# Patient Record
Sex: Male | Born: 1950 | Race: White | Hispanic: No | Marital: Married | Smoking: Never smoker
Health system: Southern US, Community
[De-identification: ages and names within clinical notes are randomized; demographics above are authoritative.]

## PROBLEM LIST (undated history)

## (undated) DIAGNOSIS — D509 Iron deficiency anemia, unspecified: Secondary | ICD-10-CM

## (undated) DIAGNOSIS — E1140 Type 2 diabetes mellitus with diabetic neuropathy, unspecified: Secondary | ICD-10-CM

## (undated) DIAGNOSIS — E785 Hyperlipidemia, unspecified: Secondary | ICD-10-CM

## (undated) DIAGNOSIS — I2510 Atherosclerotic heart disease of native coronary artery without angina pectoris: Secondary | ICD-10-CM

## (undated) DIAGNOSIS — Z789 Other specified health status: Secondary | ICD-10-CM

## (undated) DIAGNOSIS — I10 Essential (primary) hypertension: Secondary | ICD-10-CM

## (undated) DIAGNOSIS — M1990 Unspecified osteoarthritis, unspecified site: Secondary | ICD-10-CM

## (undated) DIAGNOSIS — K219 Gastro-esophageal reflux disease without esophagitis: Secondary | ICD-10-CM

## (undated) DIAGNOSIS — N400 Enlarged prostate without lower urinary tract symptoms: Secondary | ICD-10-CM

## (undated) DIAGNOSIS — H8110 Benign paroxysmal vertigo, unspecified ear: Secondary | ICD-10-CM

## (undated) HISTORY — DX: Iron deficiency anemia, unspecified: D50.9

## (undated) HISTORY — DX: Hyperlipidemia, unspecified: E78.5

## (undated) HISTORY — DX: Gastro-esophageal reflux disease without esophagitis: K21.9

## (undated) HISTORY — DX: Other specified health status: Z78.9

## (undated) HISTORY — DX: Benign prostatic hyperplasia without lower urinary tract symptoms: N40.0

## (undated) HISTORY — DX: Essential (primary) hypertension: I10

## (undated) HISTORY — DX: Unspecified osteoarthritis, unspecified site: M19.90

## (undated) HISTORY — DX: Type 2 diabetes mellitus with diabetic neuropathy, unspecified: E11.40

## (undated) HISTORY — DX: Benign paroxysmal vertigo, unspecified ear: H81.10

## (undated) HISTORY — PX: WRIST SURGERY: SHX841

## (undated) HISTORY — DX: Atherosclerotic heart disease of native coronary artery without angina pectoris: I25.10

---
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
## 1998-08-03 ENCOUNTER — Ambulatory Visit (HOSPITAL_BASED_OUTPATIENT_CLINIC_OR_DEPARTMENT_OTHER): Admission: RE | Admit: 1998-08-03 | Discharge: 1998-08-03 | Payer: Self-pay | Admitting: Orthopedic Surgery

## 2000-11-04 ENCOUNTER — Encounter: Admission: RE | Admit: 2000-11-04 | Discharge: 2000-11-04 | Payer: Self-pay | Admitting: Family Medicine

## 2000-11-04 ENCOUNTER — Encounter: Payer: Self-pay | Admitting: Family Medicine

## 2000-11-10 ENCOUNTER — Encounter: Payer: Self-pay | Admitting: General Surgery

## 2000-11-13 ENCOUNTER — Encounter: Payer: Self-pay | Admitting: General Surgery

## 2000-11-13 ENCOUNTER — Encounter (INDEPENDENT_AMBULATORY_CARE_PROVIDER_SITE_OTHER): Payer: Self-pay | Admitting: Specialist

## 2000-11-13 ENCOUNTER — Observation Stay (HOSPITAL_COMMUNITY): Admission: RE | Admit: 2000-11-13 | Discharge: 2000-11-14 | Payer: Self-pay | Admitting: General Surgery

## 2000-12-22 HISTORY — PX: CHOLECYSTECTOMY: SHX55

## 2001-01-09 ENCOUNTER — Inpatient Hospital Stay (HOSPITAL_COMMUNITY): Admission: EM | Admit: 2001-01-09 | Discharge: 2001-01-11 | Payer: Self-pay | Admitting: Emergency Medicine

## 2001-01-09 ENCOUNTER — Encounter: Payer: Self-pay | Admitting: Emergency Medicine

## 2002-11-22 ENCOUNTER — Encounter: Payer: Self-pay | Admitting: Family Medicine

## 2002-11-22 ENCOUNTER — Encounter: Admission: RE | Admit: 2002-11-22 | Discharge: 2002-11-22 | Payer: Self-pay | Admitting: Family Medicine

## 2004-05-08 ENCOUNTER — Encounter: Admission: RE | Admit: 2004-05-08 | Discharge: 2004-05-08 | Payer: Self-pay | Admitting: Gastroenterology

## 2004-07-01 ENCOUNTER — Ambulatory Visit (HOSPITAL_COMMUNITY): Admission: RE | Admit: 2004-07-01 | Discharge: 2004-07-01 | Payer: Self-pay | Admitting: Gastroenterology

## 2005-04-06 IMAGING — CT CT ABDOMEN W/ CM
1 series · 15 of 32 positions shown, 19 images · IV contrast (GASTRO & OMNIPAQUE 125)
Comparison: none

CLINICAL DATA: Right upper and left lower quadrant pain status post cholecystectomy in 9336. 
 CT ABDOMEN AND PELVIS WITH CONTRAST
TECHNIQUE: Multidetector helical CT imaging was performed during the IV bolus injection of 125 cc Omnipaque 300.  Oral contrast was given.  Correlation is made with a prior study dated 11/04/00 from [REDACTED]. 
 CT ABDOMEN
 The lung bases are clear.  There is no pleural effusion. The gallbladder is surgically absent.  There is no biliary dilatation.  6 mm low attenuation anteriorly in the liver near the middle hepatic vein on image 13 is unchanged from a prior study.  No new liver lesions are present.  The spleen, pancreas, adrenal glands, and kidneys appear normal.  There is no lymphadenopathy or ascites.
 IMPRESSION
 1.  Stable CT of the abdomen status post interval cholecystectomy.  There is no biliary dilatation.
 2.  Small liver lesion is unchanged.
 CT PELVIS
 There is no evidence of pelvic mass, fluid collection, or inflammatory process.  No adenopathy or hernia is demonstrated.
 Negative CT of the pelvis.  No inflammatory process demonstrated.

[Series 2: — · axial · 0.89mm/px · z∈[-360,+35]mm · 15 of 117 slices shown, 19 images]
[im 8/117  soft-tissue]
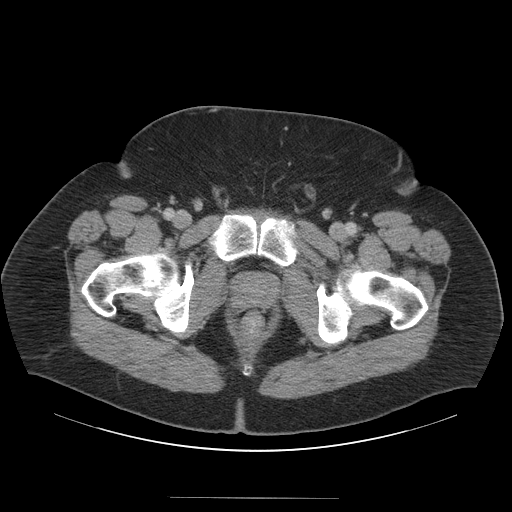
[im 8/117  bone]
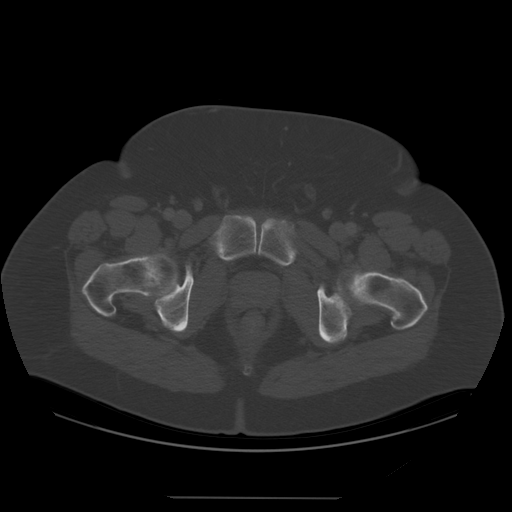
[im 15/117  soft-tissue]
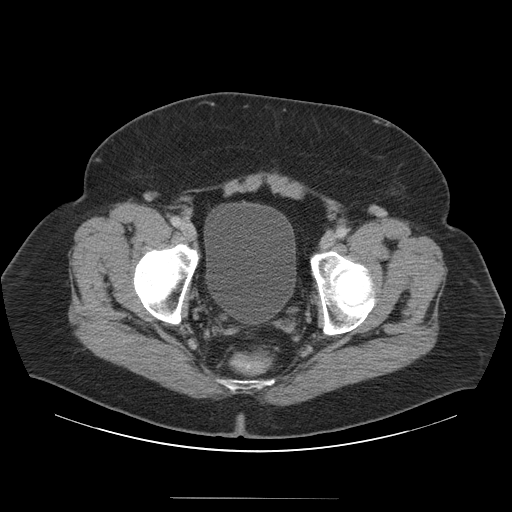
[im 23/117  soft-tissue]
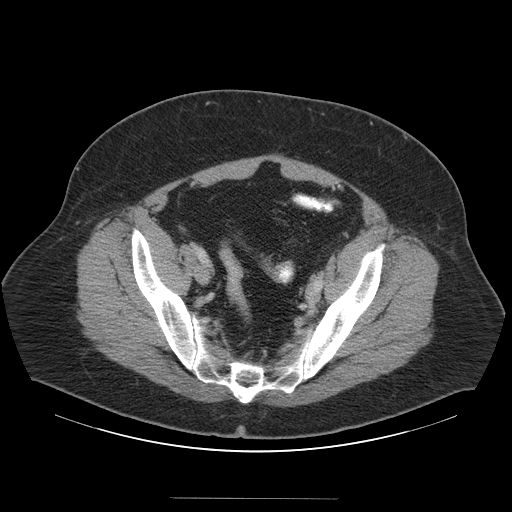
[im 34/117  soft-tissue]
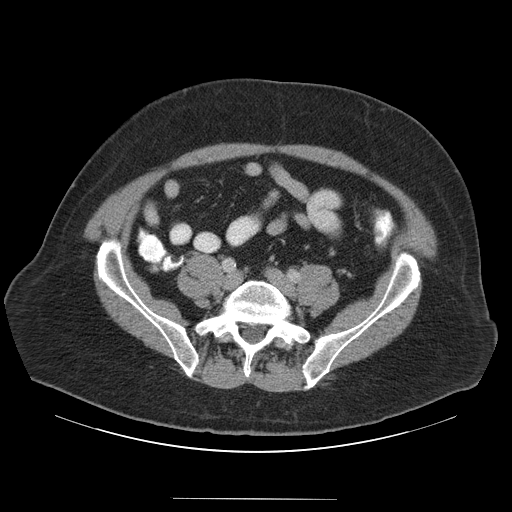
[im 42/117  soft-tissue]
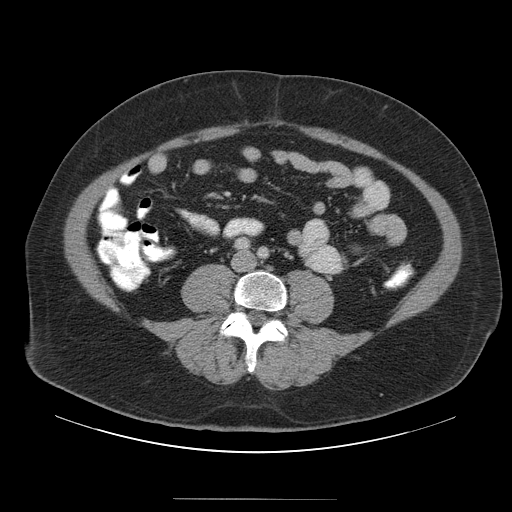
[im 49/117  soft-tissue]
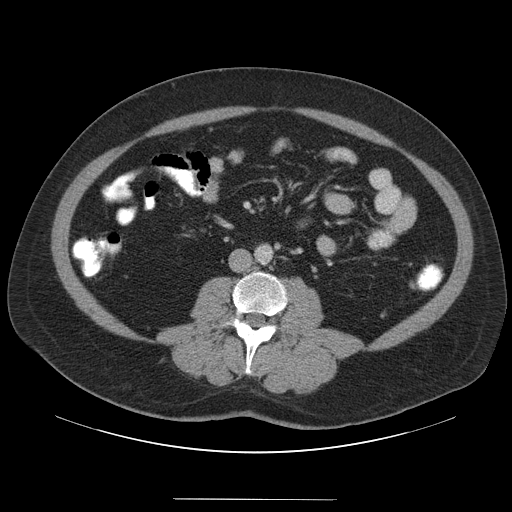
[im 60/117  soft-tissue]
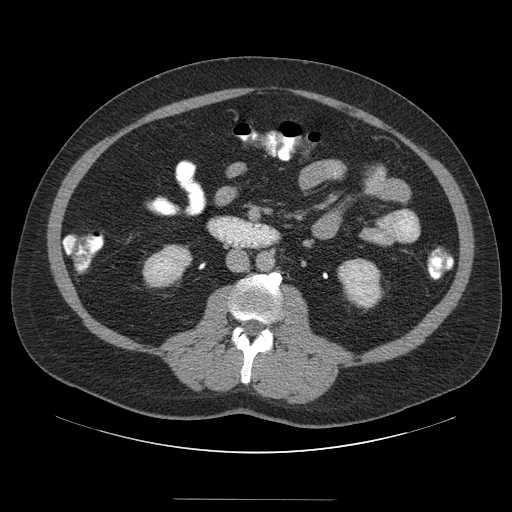
[im 68/117  soft-tissue]
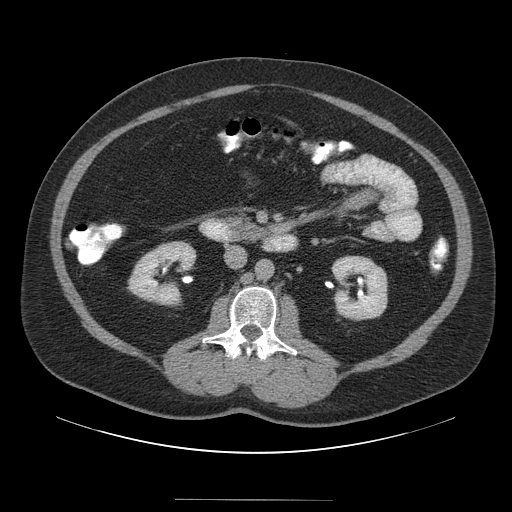
[im 75/117  soft-tissue]
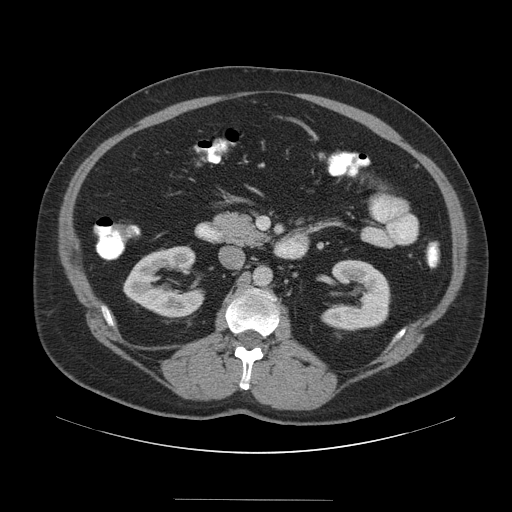
[im 75/117  bone]
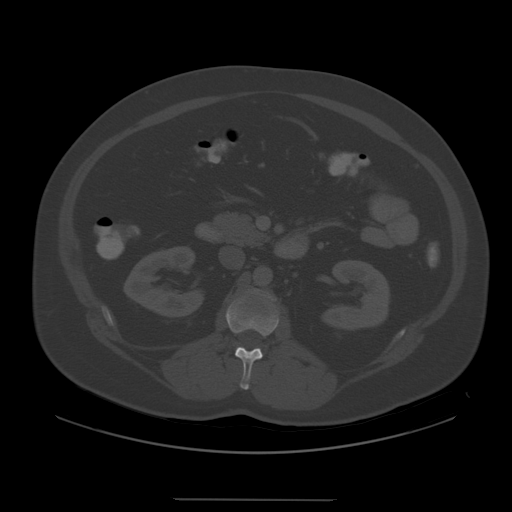
[im 83/117  soft-tissue]
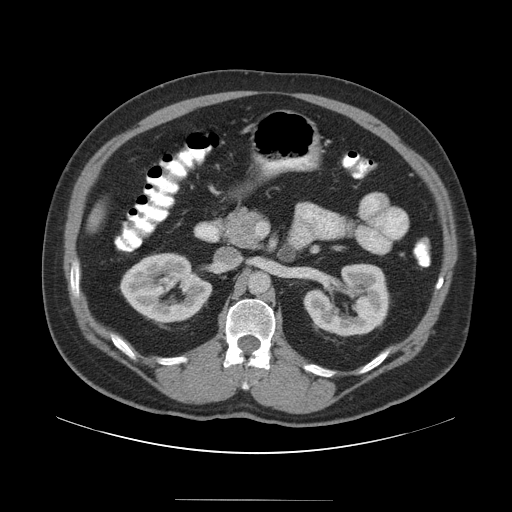
[im 94/117  soft-tissue]
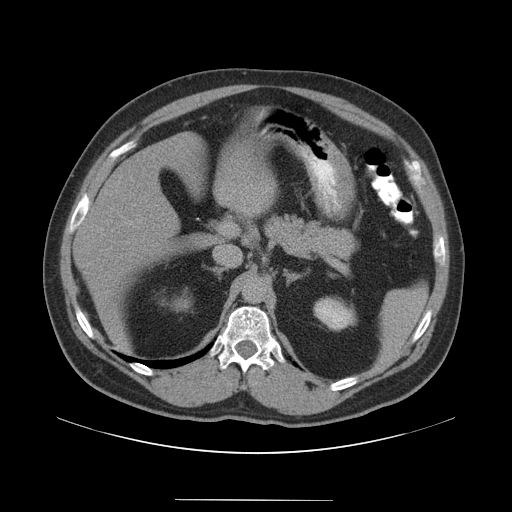
[im 102/117  soft-tissue]
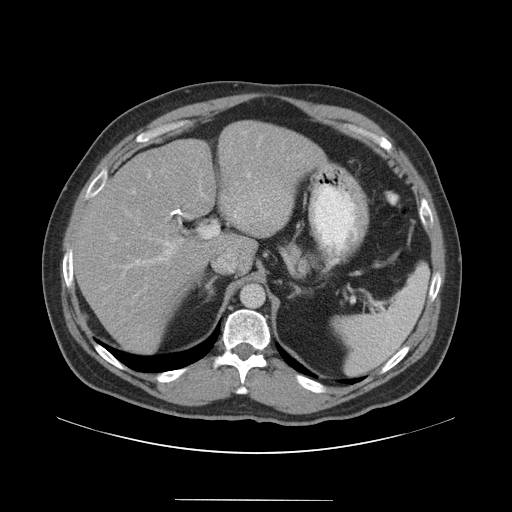
[im 102/117  lung]
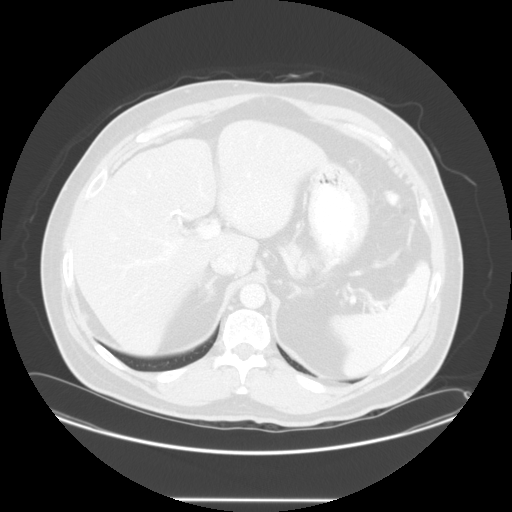
[im 105/117  lung]
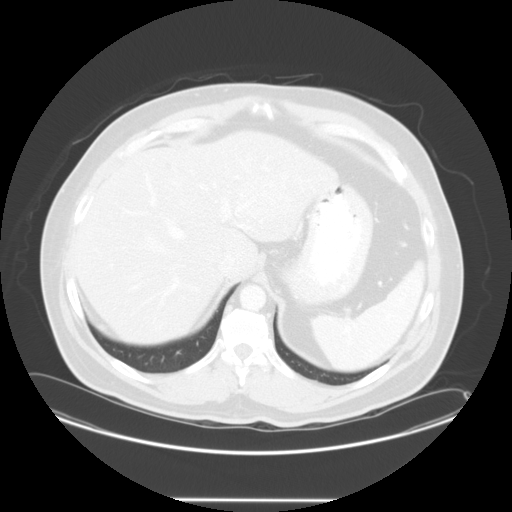
[im 109/117  soft-tissue]
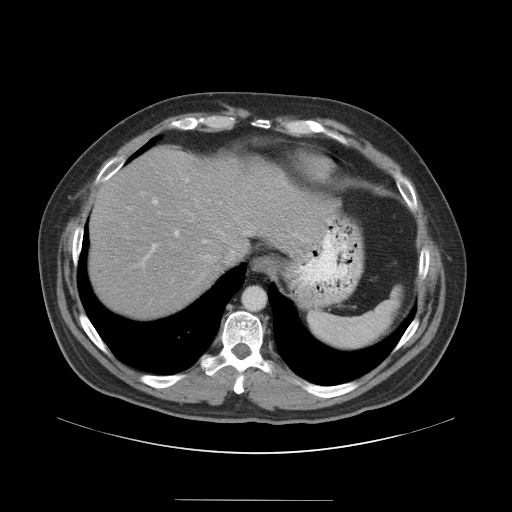
[im 109/117  lung]
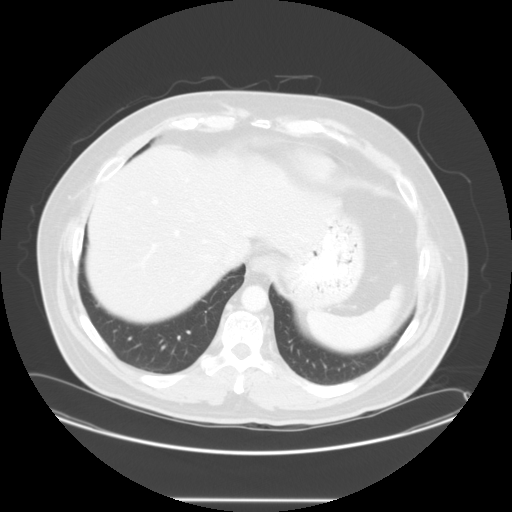
[im 113/117  lung]
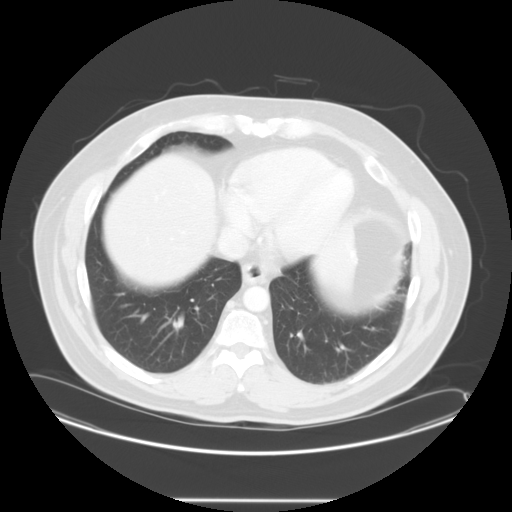

[15 of 32 positions shown; findings below may reference images not displayed]

## 2006-03-11 ENCOUNTER — Encounter: Admission: RE | Admit: 2006-03-11 | Discharge: 2006-03-11 | Payer: Self-pay | Admitting: Family Medicine

## 2006-09-12 ENCOUNTER — Encounter: Admission: RE | Admit: 2006-09-12 | Discharge: 2006-09-12 | Payer: Self-pay | Admitting: Orthopedic Surgery

## 2006-12-22 HISTORY — PX: ROTATOR CUFF REPAIR: SHX139

## 2007-01-22 ENCOUNTER — Ambulatory Visit (HOSPITAL_BASED_OUTPATIENT_CLINIC_OR_DEPARTMENT_OTHER): Admission: RE | Admit: 2007-01-22 | Discharge: 2007-01-22 | Payer: Self-pay | Admitting: Orthopedic Surgery

## 2007-02-01 ENCOUNTER — Encounter: Admission: RE | Admit: 2007-02-01 | Discharge: 2007-03-18 | Payer: Self-pay | Admitting: Orthopedic Surgery

## 2009-11-20 ENCOUNTER — Ambulatory Visit: Payer: Self-pay | Admitting: Diagnostic Radiology

## 2009-11-20 ENCOUNTER — Emergency Department (HOSPITAL_BASED_OUTPATIENT_CLINIC_OR_DEPARTMENT_OTHER): Admission: EM | Admit: 2009-11-20 | Discharge: 2009-11-21 | Payer: Self-pay | Admitting: Emergency Medicine

## 2010-10-19 IMAGING — CR DG ELBOW COMPLETE 3+V*R*
4 series · 4 of 4 positions shown · non-contrast
Comparison: None

CLINICAL DATA: Status post fall, with posterior right elbow pain.

RIGHT ELBOW - COMPLETE 3+ VIEW

[x elbow joint ap right]
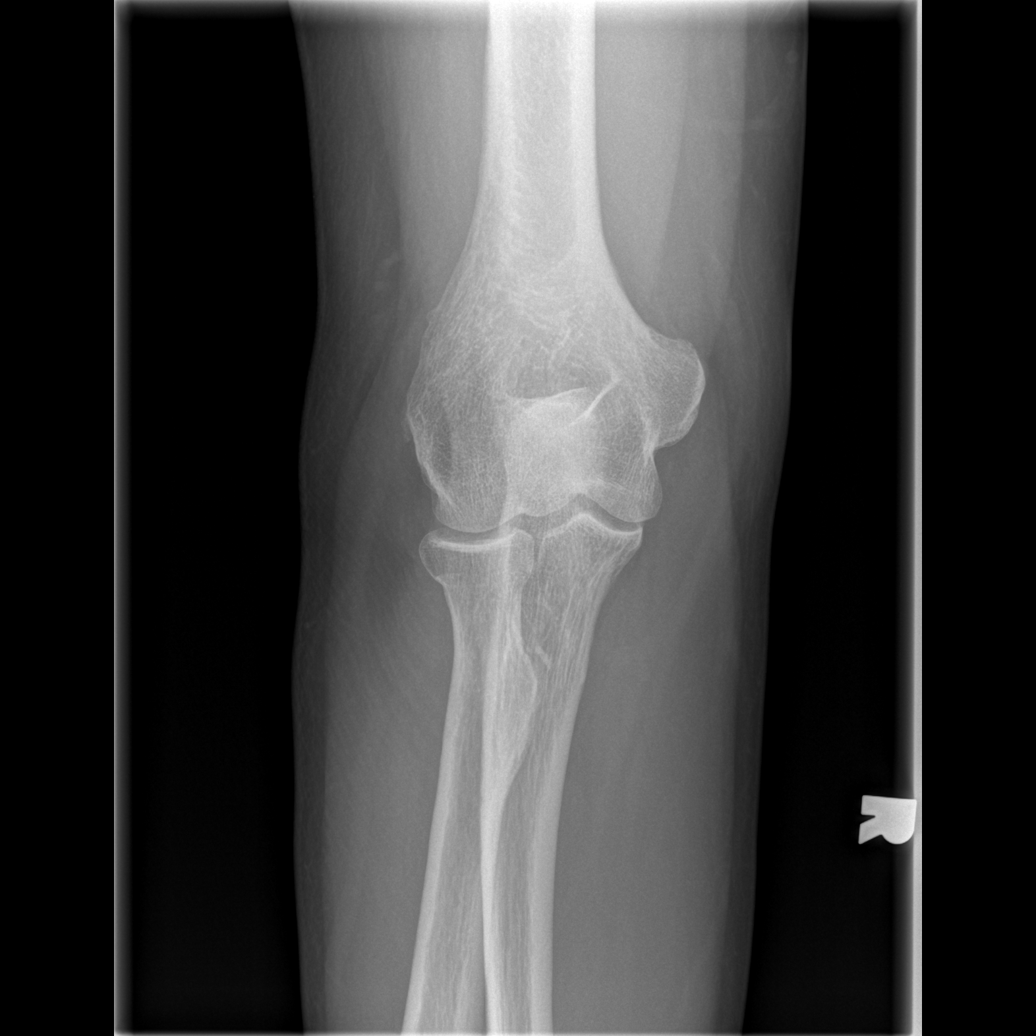

[x elbow joint obl. right (1 of 2)]
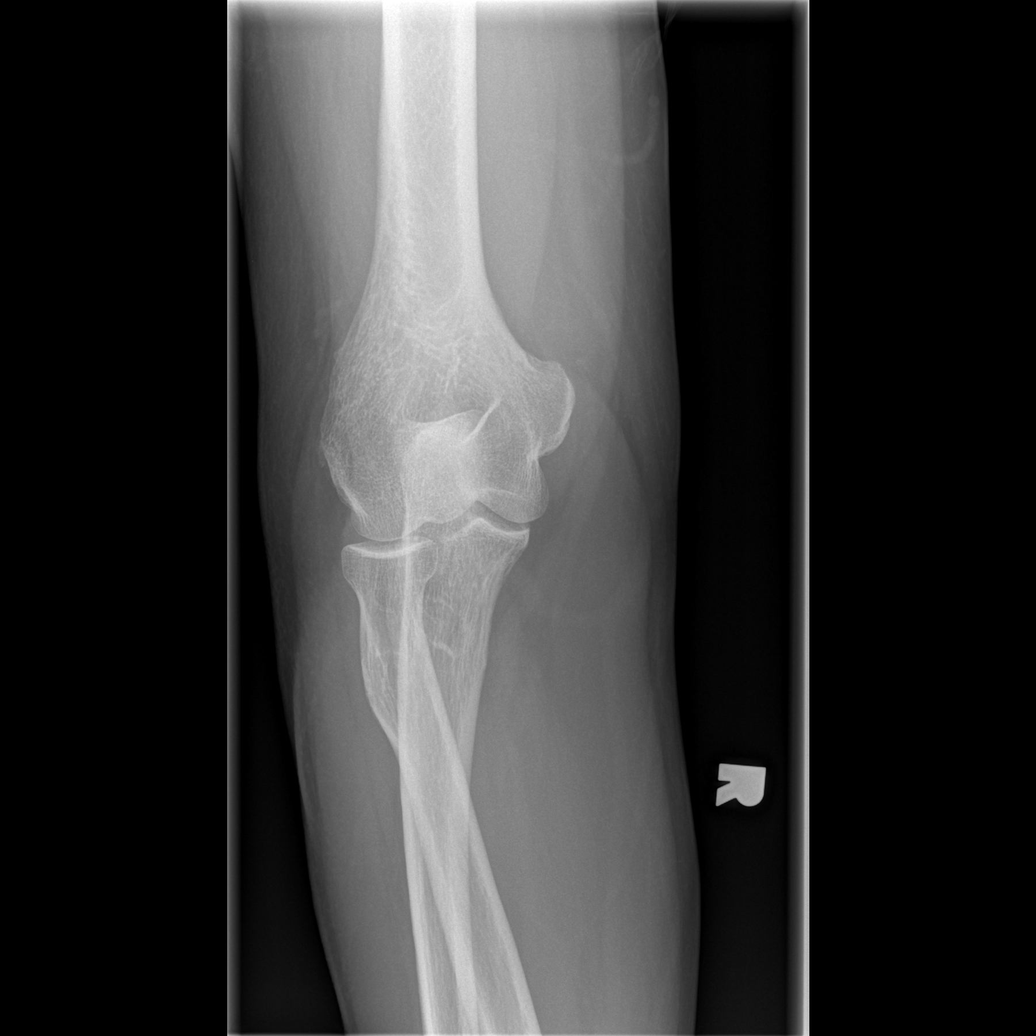

[x elbow joint obl. right (2 of 2)]
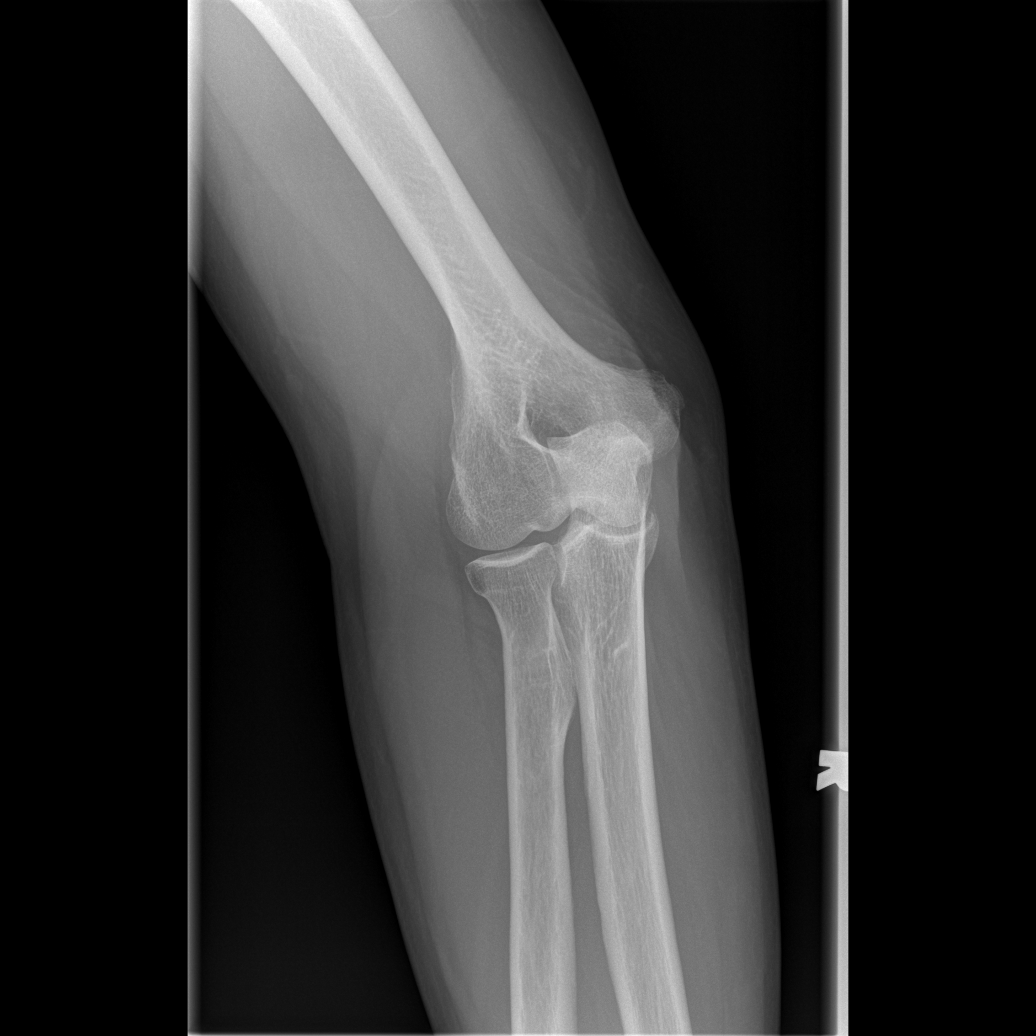

[x elbow joint lat right]
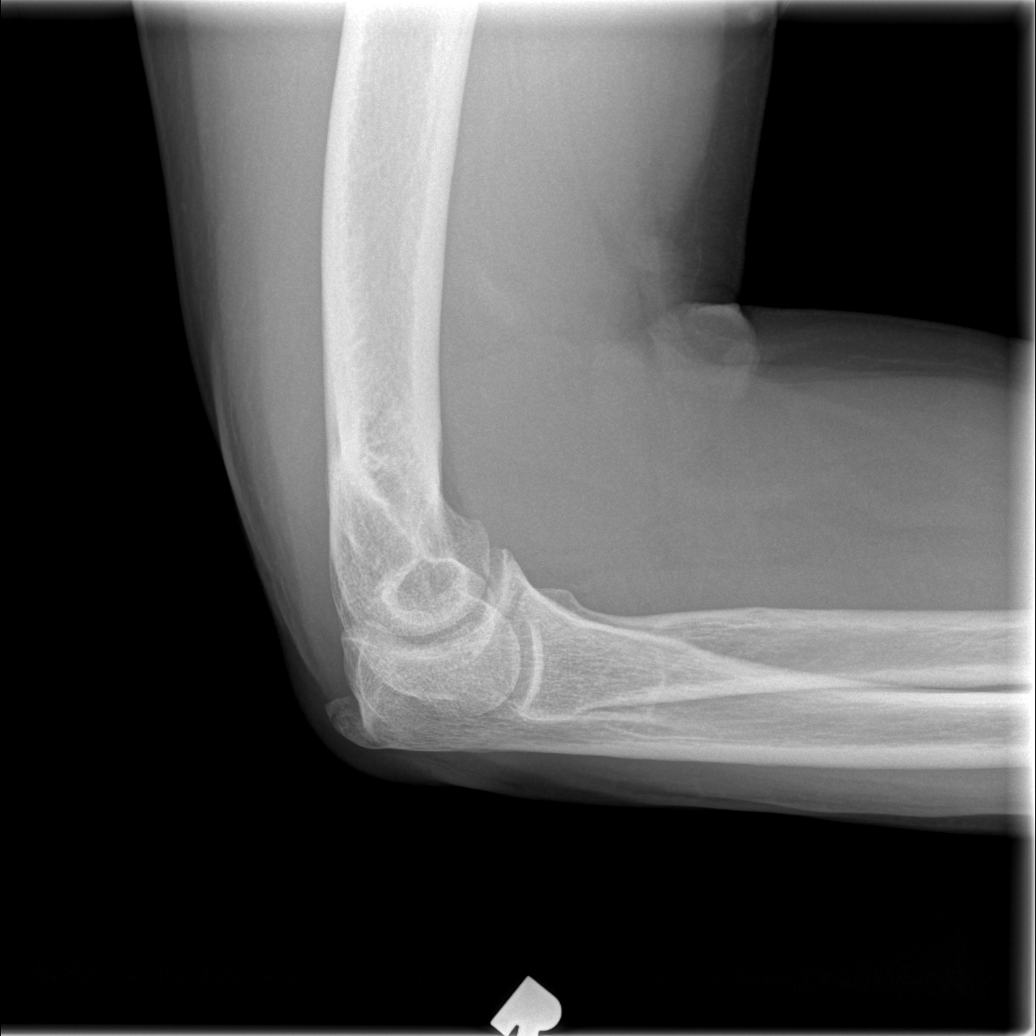

[4 of 4 positions shown; findings below may reference images not displayed]

FINDINGS: There is no evidence of fracture or dislocation.  The
joint spaces are preserved.  No significant joint effusion is
identified.  Enthesophyte formation is noted at the olecranon.  No
significant soft tissue abnormalities are seen.
IMPRESSION: No evidence of fracture or dislocation.

## 2010-12-06 ENCOUNTER — Ambulatory Visit: Payer: Self-pay | Admitting: Cardiology

## 2010-12-10 ENCOUNTER — Telehealth (INDEPENDENT_AMBULATORY_CARE_PROVIDER_SITE_OTHER): Payer: Self-pay | Admitting: *Deleted

## 2010-12-11 ENCOUNTER — Encounter: Payer: Self-pay | Admitting: Internal Medicine

## 2010-12-11 ENCOUNTER — Encounter (HOSPITAL_COMMUNITY)
Admission: RE | Admit: 2010-12-11 | Discharge: 2011-01-21 | Payer: Self-pay | Source: Home / Self Care | Attending: Cardiology | Admitting: Cardiology

## 2010-12-11 ENCOUNTER — Ambulatory Visit: Payer: Self-pay

## 2010-12-12 ENCOUNTER — Encounter: Payer: Self-pay | Admitting: Internal Medicine

## 2010-12-17 ENCOUNTER — Ambulatory Visit: Payer: Self-pay | Admitting: Cardiology

## 2011-01-12 ENCOUNTER — Encounter: Payer: Self-pay | Admitting: Family Medicine

## 2011-01-23 NOTE — Progress Notes (Signed)
Summary: Nuclear Pre-Procedure  Phone Note Outgoing Call Call back at [facility] Phone [phone]   Call placed by: Stanton Kidney, EMT-P,  December 10, 2010 2:19 PM Action Taken: Phone Call Completed Summary of Call: Left message with information on [employer] Information Sheet (see scanned document for details). Stanton Kidney, EMT-P  December 10, 2010 2:19 PM     Nuclear Med Background Indications for Stress Test: Evaluation for Ischemia   History: Heart Catheterization  History Comments: '01 Heart Cath: NL  Symptoms: Chest Pain, Diaphoresis, SOB    Nuclear Pre-Procedure Cardiac Risk Factors: Hypertension, Lipids

## 2011-01-23 NOTE — Assessment & Plan Note (Addendum)
Summary: Cardiology Nuclear Testing  Nuclear Med Background Indications for Stress Test: Evaluation for Ischemia   History: Heart Catheterization  History Comments: '01 Heart Cath: NL  Symptoms: Chest Pain, Diaphoresis, Palpitations, SOB    Nuclear Pre-Procedure Cardiac Risk Factors: Hypertension, Lipids Caffeine/Decaff Intake: None NPO After: 8:00 AM Lungs: clear IV 0.9% NS with Angio Cath: 22g     IV Site: R Antecubital IV Started by: Bonnita Levan, RN Chest Size (in) 48     Height (in): 69 Weight (lb): 235 BMI: 34.83  Nuclear Med Study 1 or 2 day study:  1 day     Stress Test Type:  Stress Reading MD:  Arvilla Meres, MD     Referring MD:  S.Tennant Resting Radionuclide:  Technetium 62m Tetrofosmin     Resting Radionuclide Dose:  11 mCi  Stress Radionuclide:  Technetium 32m Tetrofosmin     Stress Radionuclide Dose:  33 mCi   Stress Protocol Exercise Time (min):  4:17 min     Max HR:  150 bpm     Predicted Max HR:  159 bpm  Max Systolic BP: 160 mm Hg     Percent Max HR:  94.34 %     METS: 6.10 Rate Pressure Product:  16109    Stress Test Technologist:  Milana Na, EMT-P     Nuclear Technologist:  Doyne Keel, CNMT  Rest Procedure  Myocardial perfusion imaging was performed at rest 45 minutes following the intravenous administration of Technetium 2m Tetrofosmin.  Stress Procedure  The patient exercised for 4:16. The patient stopped due to fatigue, sob, and chest tightness.  There were non specific ST-T wave changes and rare pvcs.  Technetium 4m Tetrofosmin was injected at peak exercise and myocardial perfusion imaging was performed after a brief delay.  QPS Raw Data Images:  Normal; no motion artifact; normal heart/lung ratio. Stress Images:  Normal homogeneous uptake in all areas of the myocardium. Rest Images:  Normal homogeneous uptake in all areas of the myocardium. Subtraction (SDS):  Normal Transient Ischemic Dilatation:  0.92  (Normal <1.22)  Lung/Heart Ratio:  0.33  (Normal <0.45)  Quantitative Gated Spect Images QGS EDV:  62 ml QGS ESV:  20 ml QGS EF:  68 % QGS cine images:  Normal  Findings Normal nuclear study      Overall Impression  Exercise Capacity: Fair exercise capacity. BP Response: Normal blood pressure response. Clinical Symptoms: Mild chest pain/dyspnea. ECG Impression: Insignificant upsloping ST segment depression. Overall Impression: Normal stress nuclear study.  Appended Document: Cardiology Nuclear Testing copy sent to Dr.Tennant

## 2011-05-09 NOTE — Cardiovascular Report (Signed)
[place]  Patient:    Daniel Rhodes, Daniel Rhodes                      MRN: [id number] Proc. Date: 01/11/01 Adm. Date:  [id number] Attending:  Rudean Hitt CC:         Thomas A. Patty Sermons, M.D.   Cardiac Catheterization  INDICATION FOR PROCEDURE:  The patient is a 60 year old white male with history of hypertension and diabetes who presents with prolonged episode of chest pain.  CARDIOLOGIST:  Peter M. Swaziland, M.D.  ACCESS:  Via the right femoral arteries using standard Seldinger technique.  EQUIPMENT:  A 6-French 4 cm right and left Judkins catheters, 6-French pigtail catheter, 6-French arterial sheath.  MEDICATIONS:  Local anesthesia 1% Xylocaine, Versed 2 mg IV.  CONTRAST: 140 cc of Omnipaque.  HEMODYNAMIC DATA:  Aortic pressures 142/85 with a mean of 110 mmHg.  Left ventricular pressure is 149 with EDP of 26 mmHg.  CORONARY ANGIOGRAPHY: The left coronary artery arises and distributes normally.  The left main coronary artery is normal.  The left anterior descending artery has less than or equal to 10% mild irregularities proximally.  The left circumflex coronary artery is normal.  The right coronary artery is normal.  Left ventricular angiography in the RAO and LAO crania views demonstrates normal left ventricular size and contractility with normal systolic function. Ejection fraction is estimated at 65%.  There is no significant mitral valve prolapse or regurgitation.  FINAL INTERPRETATION: 1. No significant atherosclerotic coronary artery disease. 2. Normal left ventricular function. DD:  01/11/01 TD:  01/11/01 Job: [id number]

## 2011-05-09 NOTE — Op Note (Signed)
NAME:  Daniel Rhodes, Daniel Rhodes NO.:  [id number]   MEDICAL RECORD NO.:  [id number]          PATIENT TYPE:  AMB   LOCATION:  DSC                          FACILITY:  [facility]   PHYSICIAN:  Dyke Brackett, M.D.    DATE OF BIRTH:  07/10/1951   DATE OF PROCEDURE:  01/22/2007  DATE OF DISCHARGE:                               OPERATIVE REPORT   PREOPERATIVE DIAGNOSES:  1. Anterior superior labral tear.  2. Partial rotator cuff tear.  3. Impingement.  4. Acromioclavicular joint arthritis.   OPERATION:  1. Arthroscopic acromioplasty.  2. Arthroscopic debridement of torn labrum.  3. Arthroscopic excision of distal clavicle.   SURGEON:  Dyke Brackett, M.D.   ANESTHESIA:  General with a block.   INDICATIONS:  Greater than 6 months of right shoulder pain, only  transient response to injections, thought to be amenable to outpatient  surgery.   DESCRIPTION OF PROCEDURE:  Examination under anesthesia showed no loss  of motion and no instability.  He was arthroscoped through a  posterolateral and anterior portal.  Systematic inspection of the  shoulder showed the patient to have significant fraying type tearing of  the anterior superior labrum.  This was very extensive.  It was not  amenable to repair.  It involved the anterior portion and the superior  portion.  The biceps tendon anchor was intact.  Undersurface tear,  approaching, but not at 50% thickness, probably the full width of the  supraspinatus, debrided.  No degenerative change noted.   Subacromial space was entered, hypertrophied, debrided.  Followed by  decompression, resecting about 7 to 8 mm of the anterior leading edge of  the acromion.  Complete bursectomy carried out, and resection of  approximately 1 cm of the distal clavicle, which was very arthritic.  The superior surface of the cuff appeared normal.  Again, intra-  articular debridement of the labral extensive tear was carried out  separate from the  decompression.  Shoulder drained free of fluid.  Portals closed with nylon.  Lightly compressive sterile dressing  applied.  Taken to the recovery room in stable condition.      Dyke Brackett, M.D.  Electronically Signed    WDC/MEDQ  D:  01/22/2007  T:  01/22/2007  Job:  [id number]

## 2011-05-09 NOTE — Discharge Summary (Signed)
[place]  Patient:    Daniel Rhodes, Daniel Rhodes                      MRN: [id number] Adm. Date:  [id number] Disc. Date: 01/11/01 Attending:  Rudean Hitt CC:         Teena Irani. Arlyce Dice, M.D.   Discharge Summary  HISTORY OF PRESENT ILLNESS:  This is a 60 year old married Caucasian gentleman admitted on January 09, 2001, with severe substernal chest pain.  There was no history of any prior heart problems, but he does have a history of diabetes, hypertension, and hypercholesterolemia.  The patient had gone to bed feeling well, and he awoke about 1 a.m. with severe substernal chest pressure radiating to the shoulders and neck.  He was nauseated and pale but had no vomiting, but he was mildly diaphoretic.  The pain was initially a 10/10 at the onset and improved somewhat after sublingual nitroglycerin in the emergency room.  The patient was placed on an IV nitroglycerin drip, but blood pressure dropped to 80 systolic, and the IV nitroglycerin had to be stopped. His electrocardiogram and initial set of enzymes in the emergency room were negative.  FAMILY HISTORY, SOCIAL HISTORY, REVIEW OF SYSTEMS:  Please see the old chart. Of note is the fact that the patient does have a known hiatal hernia.  PHYSICAL EXAMINATION:  HEENT:  Blood pressure 130/74, pulse 89.  Jugular venous pressure normal.  SKIN:  Color good.  CHEST:  Clear.  HEART:  No murmur, gallop, rub, or click.  ABDOMEN:  Negative.  EXTREMITIES:  Negative.  LABORATORY DATA:  Initial laboratories were normal, except for a white count of 16,700.  Serial CK-MBs were negative x 3.  Troponins were negative x 3. Serial EKGs showed no acute changes.  HOSPITAL COURSE:  The patient was felt to be at high risk for ischemic heart disease because of his multiple risk factors and severe presentation and, therefore, cardiac catheterization was advised.  The patient underwent  cardiac catheterization on the morning of January 11, 2001, by Dr. Peter Swaziland.  At the catheterization he was found to have normal coronary arteries and normal LV function.  Accessory laboratory studies:  His sedimentation rate was 3.  His CBC showed initial white count of 16,000 but by the following day was 8300.  His BUN was initially 29 but on repeat was 18.  Blood sugars were 158 and 145.  The lipids showed a cholesterol of 115, triglycerides 111, HDL cholesterol 45, LDL cholesterol 48.  His urinalysis was unremarkable.  His chest x-ray showed no acute cardiopulmonary findings.  The patient had an uneventful hospital course.  He had no complications postcatheterization.  He was able to be discharged improved on the evening of the catheterization, to be followed by Dr. Dara Hoyer in the office.  DISCHARGE MEDICATIONS: 1. Protonix 40 mg daily. 2. Glipizide 10 mg daily. 3. Ecotrin 81 mg daily. 4. Ibuprofen 400 mg at h.s. 5. Lipitor 10 mg each evening. 6. Toprol XL 50 mg once a day for blood pressure. 7. For the time being, we will continue to leave off his HCTZ, and his blood    pressure has been quite stable in the hospital.  DIET:  Low salt, low cholesterol diet.  ACTIVITY:  Avoid any heavy lifting, stooping, or squatting for the next few days.  FOLLOW-UP:  He will follow up with Dr. Arlyce Dice, and see Dr. Patty Sermons again on a p.r.n. basis.  DISCHARGE INSTRUCTIONS:  He was encouraged to embark on a regular exercise program for long-term cardiovascular fitness.  CONDITION ON DISCHARGE:  Improved. DD:  01/11/01 TD:  01/11/01 Job: [id number]

## 2011-05-09 NOTE — Op Note (Signed)
NAME:  Daniel Rhodes, Daniel Rhodes                         ACCOUNT NO.:  [id number]   MEDICAL RECORD NO.:  [id number]                   PATIENT TYPE:  AMB   LOCATION:  ENDO                                 FACILITY:  [facility]   PHYSICIAN:  Anselmo Rod, M.D.               DATE OF BIRTH:  1951-01-28   DATE OF PROCEDURE:  07/01/2004  DATE OF DISCHARGE:                                 OPERATIVE REPORT   PROCEDURE PERFORMED:  Screening colonoscopy.   ENDOSCOPIST:  Anselmo Rod, M.D.   INSTRUMENT USED:  Olympus video colonoscope.   INDICATION FOR PROCEDURE:  A 60 year old white male with a history of right  upper quadrant abdominal pain, status post laparoscopic cholecystectomy, and  AODM.  Undergoing a screening colonoscopy.  Rule out colonic polyps, masses,  etc.   PREPROCEDURE PREPARATION:  Informed consent was procured from the patient.  The patient had fasted for eight hours prior to the procedure and prepped  with a bottle of magnesium citrate and a gallon of GoLYTELY the night prior  to the procedure.   PREPROCEDURE PHYSICAL:  VITAL SIGNS:  The patient had stable vital signs.  NECK:  Supple.  CHEST:  Clear to auscultation.  S1, S2 regular.  ABDOMEN:  Soft with normal bowel sounds.   DESCRIPTION OF PROCEDURE:  The patient was placed in the left lateral  decubitus position and sedated with 90 mg of Demerol and 7.5 mg of Versed  intravenously in slow incremental doses.  Once the patient was adequately  sedate and maintained on low-flow oxygen and continuous cardiac monitoring,  the Olympus video colonoscope was advanced from the rectum to the cecum.  The appendiceal orifice and the ileocecal valve were clearly visualized and  photographed.  No masses, polyps, erosions, ulcerations, or diverticula were  seen.  Internal hemorrhoids were recognized on retroflexion in the rectum.  The patient tolerated the procedure well without complications.   IMPRESSION:  1. Small internal hemorrhoids,  otherwise unrevealing colonoscopy.  2. Some residual stool in the colon, multiple washes were done.   RECOMMENDATIONS:  1. A repeat colonoscopy has been recommended in the next 10 years unless the     patient develops any abnormal     symptoms in the interim.  2. Continue a high-fiber diet with liberal fluid intake.  3. Outpatient follow-up to re-evaluate right upper quadrant pain, question     adhesions.                                               Anselmo Rod, M.D.    JNM/MEDQ  D:  07/01/2004  T:  07/01/2004  Job:  [id number]   cc:   Teena Irani. Arlyce Dice, M.D.  [address]  [place]  [place] [address]  Fax: [phone]

## 2011-05-09 NOTE — Op Note (Signed)
[facility]  Patient:    Daniel, Rhodes                     MRN: [id number] Proc. Date: 11/13/00 Attending:  Anselm Pancoast. Zachery Dakins, M.D. CC:         Teena Irani. Arlyce Dice, M.D.   Operative Report  PREOPERATIVE DIAGNOSES: 1. Chronic cholelithiasis. 2. History of diabetes mellitus.  POSTOPERATIVE DIAGNOSES: 1. Chronic cholelithiasis. 2. History of diabetes mellitus.  OPERATION:  Laparoscopic cholecystectomy with cholangiogram.  SURGEON:  Anselm Pancoast. Zachery Dakins, M.D.  ASSISTANT:  Gita Kudo, M.D.  ANESTHESIA:  General.  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 Dara Hoyer for symptomatic gallstones.  The patients more recent problem has really been more of a chronic prostatis but he has had some kind of substernal, upper abdominal pain, as well as the fullness in the lower abdomen.  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  The patients liver function studies were normal and since he has these intermittent episodes of epigastric pain, they referred him to our office earlier this week for evaluation and a cholecystectomy.  The patient has not had any severe episodes of pain and he presented to the emergency room, etc., but obviously he is having minimal symptoms with his gallbladder being a diabetic, and we recommended proceeding with a laparoscopic cholecystectomy and cholangiogram.  The only thing that is new is today he has sort of a fine rash over his body. No fever, no upper respiratory tract infection like the flu, etc., and I elected to proceed on with surgery.  DESCRIPTION OF PROCEDURE:  Preoperatively, he was given 3 g of Unasyn and then taken back to the operative suite.  Induction of general anesthesia with an endotracheal tube and oral tube into the stomach.  The patient was  moderately overweight and the abdomen was prepped with Betadine surgical scrub and solution and draped in sterile manner.  A small opening was made below the umbilicus down through the adipose tissue.  The fascia was then divided, a small opening made.  Then two Kochers were used to grasp the fascia.  The underlying peritoneum was kind of carefully identified and sorto f pegged over with a blunt Tresa Endo.  A traction suture was placed and the Hasson cannula was introduced.  Carbon dioxide was infused and the camera then inserted.  The gallbladder was very tense but not acutely inflamed.  The upper 10 mm trocar was made through the midline fascia.  The two lateral 5 mm trocars were placed by Dr. Maryagnes Amos.  We anesthetized the fascia and all with Marcaine and then the gallbladder was grasped and retracted upward and outward.  The gallbladder proximally was very tense, fortunately not acutely inflamed and the peritoneum and the fatty tissue was peeled away carefully. The cystic duct identified.  This was clipped flush with the gallbladder.  A Taut catheter introduced and an x-ray obtained which showed good prompt filling into the common bile duct and good flow into the duodenum.  The intrahepatic radicals filled and there was no evidence of any common duct stones.  The cystic duct then triply clipped, divided.  The cystic artery was identified and this was doubly clipped proximally and singly distally and divided, and then the gallbladder was kind of freed from its bed with electrocautery.  Basically  peritoneum kind of exposed on a raw liver bed but there was fortunately not much bleeding and this was coagulized with the cautery.  The gallbladder was then grasped, the camera placed in the upper 10 mm trocar and then pulling the gallbladder up through the fascia, opened the gallbladder, removed the bile, and then the gallbladder flipped through the fascia. There were three small stones in the  gallbladder.  I had cultured the bile aerobically.  The wound was irrigated and then I closed the midline fascia with two figure-of-eights of 0 Vicryl suture and then the subcutaneous wounds, after the fascia had been anesthetized with Marcaine, was closed with 4-0 Vicryl and then benzoin and Steri-Strips on the skin.  The patient tolerated the procedure nicely and was extubated and taken to the recovery room in stable postop condition.  The patient possibly wants to go home this afternoon and we will see how he is doing.  He will continue on his chronic diabetic medications. DD:  11/13/00 TD:  11/13/00 Job: [id number]
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
## 2012-06-01 DIAGNOSIS — L219 Seborrheic dermatitis, unspecified: Secondary | ICD-10-CM | POA: Insufficient documentation

## 2016-03-19 ENCOUNTER — Ambulatory Visit: Payer: Self-pay | Admitting: Cardiology

## 2016-03-20 ENCOUNTER — Ambulatory Visit (INDEPENDENT_AMBULATORY_CARE_PROVIDER_SITE_OTHER): Payer: Medicare Other | Admitting: Cardiology

## 2016-03-20 ENCOUNTER — Other Ambulatory Visit: Payer: Self-pay | Admitting: Cardiology

## 2016-03-20 ENCOUNTER — Encounter: Payer: Self-pay | Admitting: Cardiology

## 2016-03-20 VITALS — BP 116/74 | HR 84 | Ht 69.0 in | Wt 207.0 lb

## 2016-03-20 DIAGNOSIS — Z01818 Encounter for other preprocedural examination: Secondary | ICD-10-CM | POA: Diagnosis not present

## 2016-03-20 DIAGNOSIS — R0602 Shortness of breath: Secondary | ICD-10-CM

## 2016-03-20 DIAGNOSIS — R9439 Abnormal result of other cardiovascular function study: Secondary | ICD-10-CM

## 2016-03-20 DIAGNOSIS — I10 Essential (primary) hypertension: Secondary | ICD-10-CM | POA: Diagnosis not present

## 2016-03-20 DIAGNOSIS — E119 Type 2 diabetes mellitus without complications: Secondary | ICD-10-CM

## 2016-03-20 DIAGNOSIS — E785 Hyperlipidemia, unspecified: Secondary | ICD-10-CM

## 2016-03-20 DIAGNOSIS — Z789 Other specified health status: Secondary | ICD-10-CM

## 2016-03-20 NOTE — Patient Instructions (Signed)
Your physician recommends that you schedule a follow-up appointment in: will be scheduled at hospital after cath   Your physician has requested that you have a cardiac catheterization Friday April 7 th at 9 am with Dr Herbie BaltimoreHarding. Cardiac catheterization is used to diagnose and/or treat various heart conditions. Doctors may recommend this procedure for a number of different reasons. The most common reason is to evaluate chest pain. Chest pain can be a symptom of coronary artery disease (CAD), and cardiac catheterization can show whether plaque is narrowing or blocking your heart's arteries. This procedure is also used to evaluate the valves, as well as measure the blood flow and oxygen levels in different parts of your heart. For further information please visit [email or web address] Please follow instruction sheet, as given.   HOLD METFORMIN DAY BEFORE CATH,DAY OF, AND 2 DAYS AFTER CATH  HOLD GLIPIZIDE THE AM OF CATH   GET CHEST X-RAY TODAY    Thank you for choosing [facility] !

## 2016-03-20 NOTE — Progress Notes (Signed)
Cardiology Office Note  Date: 03/20/2016   ID: Daniel Rhodes, DOB 02-Jun-1951, MRN [id number]  PCP: Lindajo RoyalSanders, Kirk, MD  Consulting Cardiologist: Nona DellSamuel McDowell, MD   Chief Complaint  Patient presents with  . Abnormal GXT  . Shortness of Breath    History of Present Illness: Daniel Rhodes is a 65 y.o. male referred for cardiology consultation by Dr. Adrienne MochaKirk Saunders. He presents describing a 6 month history of decreased stamina and worsening dyspnea on exertion. He has been working on losing weight over the last several years, has lost over 30 pounds, but has felt like his typical activities have been more difficult despite this. He does not endorse exertional chest pain however. Symptoms are moderate and have been progressive recently. He was seen by his primary care provider and referred for a GXT which was done recently.  I received a faxed copy of the patient's GXT from March 24, very difficult to read. There is significant lead motion artifact, and difficulty interpreting the ST segments. The ST segment changes noted are equivocal, and not consistently noted throughout the tracing. Patient does not report having chest pain during the test. He states that the physician present was concerned about abnormal findings and stopped the test.  I reviewed his cardiac history. He underwent a cardiac catheterization in 2002 for chest pain, at that time finding normal coronary arteries. He does have reflux symptoms and recalls having a switch in his PPI at that point that was ultimately associated with his worsening chest pain.  ECG today in the office shows sinus rhythm with decreased anterior R-wave progression.  Today we discussed further evaluation of his symptoms in light of abnormal screening GXT. We discussed options including further noninvasive testing with better sensitivity and specificity, versus proceeding to a diagnostic cardiac catheterization for clear definition of his coronary  anatomy. After reviewing the risks and benefits, he is in agreement to proceed with cardiac catheterization.  Past Medical History  Diagnosis Date  . GERD (gastroesophageal reflux disease)   . BPH (benign prostatic hyperplasia)   . Osteoarthritis   . Diabetes mellitus with neuropathy (HCC)   . Essential hypertension   . History of cardiac catheterization     Normal coronaries 2002  . Hyperlipidemia   . Benign positional vertigo   . Iron deficiency anemia   . Statin intolerance     Past Surgical History  Procedure Laterality Date  . Cholecystectomy  2002  . Toe surgery  1963  . Rotator cuff repair Right 2008  . Wrist surgery Left     Current Outpatient Prescriptions  Medication Sig Dispense Refill  . aspirin 81 MG tablet Take 81 mg by mouth daily.    . cyclobenzaprine (FLEXERIL) 10 MG tablet Take 10 mg by mouth 3 (three) times daily as needed.    Marland Kitchen. esomeprazole (NEXIUM) 40 MG capsule Take 40 mg by mouth daily before breakfast.    . FISH OIL-KRILL OIL PO Take 50,000 mg by mouth once a week.    Marland Kitchen. glipiZIDE (GLUCOTROL XL) 10 MG 24 hr tablet Take 10 mg by mouth 2 (two) times daily.    . hydrochlorothiazide (HYDRODIURIL) 25 MG tablet Take 25 mg by mouth daily.    Marland Kitchen. ibuprofen (ADVIL,MOTRIN) 800 MG tablet Take 800 mg by mouth every 8 (eight) hours as needed.    . Liraglutide 18 MG/3ML SOPN Inject 18 mg into the skin at bedtime.    . metFORMIN (GLUCOPHAGE) 1000 MG tablet Take 1,000 mg  by mouth 2 (two) times daily with a meal.    . Tamsulosin HCl (FLOMAX) 0.4 MG CAPS Take by mouth daily after supper.     No current facility-administered medications for this visit.   Allergies:  Statins; Celecoxib; and Sulfonamide derivatives   Social History: The patient  reports that he has never smoked. He does not have any smokeless tobacco history on file. He reports that he drinks alcohol.   Family History: The patient's family history includes Heart attack in his father and mother.   ROS:   Please see the history of present illness. Otherwise, complete review of systems is positive for intermittent reflux.  All other systems are reviewed and negative.   Physical Exam: VS:  BP 116/74 mmHg  Pulse 84  Ht 5\' 9"  (1.753 m)  Wt 207 lb (93.895 kg)  BMI 30.55 kg/m2  SpO2 96%, BMI Body mass index is 30.55 kg/(m^2).  Wt Readings from Last 3 Encounters:  03/20/16 207 lb (93.895 kg)  12/11/10 235 lb (106.595 kg)    General: Overweight male, appears comfortable at rest. HEENT: Conjunctiva and lids normal, oropharynx clear. Neck: Supple, no elevated JVP or carotid bruits, no thyromegaly. Lungs: Clear to auscultation, nonlabored breathing at rest. Cardiac: Regular rate and rhythm, no S3 or significant systolic murmur, no pericardial rub. Abdomen: Soft, nontender, bowel sounds present, no guarding or rebound. Extremities: No pitting edema, distal pulses 2+. Skin: Warm and dry. Musculoskeletal: No kyphosis. Neuropsychiatric: Alert and oriented x3, affect grossly appropriate.  ECG: ECG is ordered today and reviewed above. No old tracing for comparison.  Recent Labwork:  March 2017: Hemoglobin A1c 6.8, hemoglobin 11.0, platelets 380, BUN 20, creatinine 1.1, potassium 4.4, AST 18, ALT 26  Other Studies Reviewed Today:  Cardiac catheterization 01/11/2001: CORONARY ANGIOGRAPHY: The left coronary artery arises and distributes normally.  The left main coronary artery is normal.  The left anterior descending artery has less than or equal to 10% mild irregularities proximally.  The left circumflex coronary artery is normal.  The right coronary artery is normal.  Left ventricular angiography in the RAO and LAO crania views demonstrates normal left ventricular size and contractility with normal systolic function. Ejection fraction is estimated at 65%. There is no significant mitral valve prolapse or regurgitation.  FINAL INTERPRETATION: 1. No significant atherosclerotic  coronary artery disease. 2. Normal left ventricular function.  Assessment and Plan:   1. Progressive exertional fatigue and shortness of breath over the last 6 months. Patient had a screening GXT done recently that showed equivocal findings. He had risk factors include age and gender, history of hyperlipidemia as well as hypertension and diabetes mellitus. Also some family history of premature CAD. He had a normal cardiac catheterization in 2002. We discussed options for further evaluation, and plan is to proceed with a diagnostic cardiac catheterization to most clearly outline his coronary anatomy. He is in agreement to proceed.  2. Essential hypertension, blood pressure is well controlled today. He is on hydrochlorothiazide.  3. History of hyperlipidemia with statin intolerance. He does take omega-3 supplements.  4. Type 2 diabetes mellitus, on Glucophage and Glucotrol XL. He follows with Dr. Lelon PerlaSaunders. Recent hemoglobin A1c was 6.8.  Current medicines were reviewed with the patient today.   Orders Placed This Encounter  Procedures  . DG Chest 2 View  . EKG 12-Lead    Disposition: FU with me after cardiac catheterization.  Signed, Jonelle SidleSamuel G. McDowell, MD, [facility] 03/20/2016 10:02 AM    [facility]  [facility] [place]. [address], [place], [place] [address] Phone: [phone]; Fax: [phone]

## 2016-03-24 DIAGNOSIS — I200 Unstable angina: Secondary | ICD-10-CM | POA: Diagnosis present

## 2016-03-24 DIAGNOSIS — R0609 Other forms of dyspnea: Secondary | ICD-10-CM | POA: Diagnosis present

## 2016-03-24 DIAGNOSIS — R9439 Abnormal result of other cardiovascular function study: Secondary | ICD-10-CM | POA: Diagnosis present

## 2016-03-24 DIAGNOSIS — R0600 Dyspnea, unspecified: Secondary | ICD-10-CM | POA: Diagnosis present

## 2016-03-28 ENCOUNTER — Ambulatory Visit (HOSPITAL_COMMUNITY)
Admission: RE | Admit: 2016-03-28 | Discharge: 2016-03-28 | Disposition: A | Discharge status: Home / Self Care | Payer: Medicare Other | Source: Ambulatory Visit | Attending: Cardiology | Admitting: Cardiology

## 2016-03-28 ENCOUNTER — Encounter (HOSPITAL_COMMUNITY): Payer: Self-pay | Admitting: Cardiology

## 2016-03-28 ENCOUNTER — Encounter (HOSPITAL_COMMUNITY)
Admission: RE | Disposition: A | Discharge status: Home / Self Care | Payer: Self-pay | Source: Ambulatory Visit | Attending: Cardiology

## 2016-03-28 DIAGNOSIS — E1140 Type 2 diabetes mellitus with diabetic neuropathy, unspecified: Secondary | ICD-10-CM | POA: Insufficient documentation

## 2016-03-28 DIAGNOSIS — E785 Hyperlipidemia, unspecified: Secondary | ICD-10-CM | POA: Diagnosis not present

## 2016-03-28 DIAGNOSIS — M1990 Unspecified osteoarthritis, unspecified site: Secondary | ICD-10-CM | POA: Insufficient documentation

## 2016-03-28 DIAGNOSIS — Z7984 Long term (current) use of oral hypoglycemic drugs: Secondary | ICD-10-CM | POA: Diagnosis not present

## 2016-03-28 DIAGNOSIS — I25110 Atherosclerotic heart disease of native coronary artery with unstable angina pectoris: Secondary | ICD-10-CM

## 2016-03-28 DIAGNOSIS — Z8249 Family history of ischemic heart disease and other diseases of the circulatory system: Secondary | ICD-10-CM | POA: Insufficient documentation

## 2016-03-28 DIAGNOSIS — K219 Gastro-esophageal reflux disease without esophagitis: Secondary | ICD-10-CM | POA: Insufficient documentation

## 2016-03-28 DIAGNOSIS — I10 Essential (primary) hypertension: Secondary | ICD-10-CM | POA: Diagnosis present

## 2016-03-28 DIAGNOSIS — N400 Enlarged prostate without lower urinary tract symptoms: Secondary | ICD-10-CM | POA: Diagnosis not present

## 2016-03-28 DIAGNOSIS — Z7982 Long term (current) use of aspirin: Secondary | ICD-10-CM | POA: Insufficient documentation

## 2016-03-28 DIAGNOSIS — Z79899 Other long term (current) drug therapy: Secondary | ICD-10-CM | POA: Insufficient documentation

## 2016-03-28 DIAGNOSIS — R0600 Dyspnea, unspecified: Secondary | ICD-10-CM | POA: Diagnosis present

## 2016-03-28 DIAGNOSIS — I200 Unstable angina: Secondary | ICD-10-CM | POA: Diagnosis present

## 2016-03-28 DIAGNOSIS — R9439 Abnormal result of other cardiovascular function study: Secondary | ICD-10-CM | POA: Diagnosis present

## 2016-03-28 DIAGNOSIS — R0609 Other forms of dyspnea: Secondary | ICD-10-CM | POA: Diagnosis present

## 2016-03-28 HISTORY — PX: CARDIAC CATHETERIZATION: SHX172

## 2016-03-28 LAB — PROTIME-INR
INR: 1 (ref 0.00–1.49)
Prothrombin Time: 13.4 seconds (ref 11.6–15.2)

## 2016-03-28 LAB — CBC
HEMATOCRIT: 36.6 % — AB (ref 39.0–52.0)
HEMOGLOBIN: 10.9 g/dL — AB (ref 13.0–17.0)
MCH: 21.8 pg — ABNORMAL LOW (ref 26.0–34.0)
MCHC: 29.8 g/dL — AB (ref 30.0–36.0)
MCV: 73.2 fL — AB (ref 78.0–100.0)
Platelets: 348 10*3/uL (ref 150–400)
RBC: 5 MIL/uL (ref 4.22–5.81)
RDW: 15.8 % — ABNORMAL HIGH (ref 11.5–15.5)
WBC: 7.1 10*3/uL (ref 4.0–10.5)

## 2016-03-28 LAB — POCT ACTIVATED CLOTTING TIME: ACTIVATED CLOTTING TIME: 203 s

## 2016-03-28 LAB — BASIC METABOLIC PANEL
ANION GAP: 13 (ref 5–15)
BUN: 19 mg/dL (ref 6–20)
CHLORIDE: 99 mmol/L — AB (ref 101–111)
CO2: 25 mmol/L (ref 22–32)
Calcium: 9.3 mg/dL (ref 8.9–10.3)
Creatinine, Ser: 1.05 mg/dL (ref 0.61–1.24)
GFR calc Af Amer: >60 mL/min (ref >60)
GLUCOSE: 199 mg/dL — AB (ref 65–99)
POTASSIUM: 3.9 mmol/L (ref 3.5–5.1)
Sodium: 137 mmol/L (ref 135–145)

## 2016-03-28 LAB — GLUCOSE, CAPILLARY: GLUCOSE-CAPILLARY: 191 mg/dL — AB (ref 65–99)

## 2016-03-28 SURGERY — LEFT HEART CATH AND CORONARY ANGIOGRAPHY

## 2016-03-28 MED ORDER — IOPAMIDOL (ISOVUE-370) INJECTION 76%
INTRAVENOUS | Status: DC | PRN
  Administered 2016-03-28: 135 mL via INTRAVENOUS

## 2016-03-28 MED ORDER — SODIUM CHLORIDE 0.9 % IV SOLN: 250.0000 mL | INTRAVENOUS | Status: DC | PRN

## 2016-03-28 MED ORDER — HEPARIN SODIUM (PORCINE) 1000 UNIT/ML IJ SOLN
INTRAMUSCULAR | Status: AC
  Filled 2016-03-28: qty 1

## 2016-03-28 MED ORDER — ONDANSETRON HCL 4 MG/2ML IJ SOLN: 4.0000 mg | Freq: Four times a day (QID) | INTRAMUSCULAR | Status: DC | PRN

## 2016-03-28 MED ORDER — HEPARIN SODIUM (PORCINE) 1000 UNIT/ML IJ SOLN
INTRAMUSCULAR | Status: DC | PRN
  Administered 2016-03-28: 3000 [IU] via INTRAVENOUS
  Administered 2016-03-28: 2000 [IU] via INTRAVENOUS
  Administered 2016-03-28: 5000 [IU] via INTRAVENOUS

## 2016-03-28 MED ORDER — NITROGLYCERIN 1 MG/10 ML FOR IR/CATH LAB
INTRA_ARTERIAL | Status: DC | PRN
  Administered 2016-03-28: 200 ug

## 2016-03-28 MED ORDER — LIDOCAINE HCL (PF) 1 % IJ SOLN
INTRAMUSCULAR | Status: AC
  Filled 2016-03-28: qty 30

## 2016-03-28 MED ORDER — ISOSORBIDE MONONITRATE ER 30 MG PO TB24: 30.0000 mg | ORAL_TABLET | Freq: Every day | ORAL | Status: DC

## 2016-03-28 MED ORDER — MIDAZOLAM HCL 2 MG/2ML IJ SOLN
INTRAMUSCULAR | Status: AC
  Filled 2016-03-28: qty 2

## 2016-03-28 MED ORDER — NITROGLYCERIN 1 MG/10 ML FOR IR/CATH LAB
INTRA_ARTERIAL | Status: AC
  Filled 2016-03-28: qty 10

## 2016-03-28 MED ORDER — SODIUM CHLORIDE 0.9% FLUSH: 3.0000 mL | Freq: Two times a day (BID) | INTRAVENOUS | Status: DC

## 2016-03-28 MED ORDER — HEPARIN (PORCINE) IN NACL 2-0.9 UNIT/ML-% IJ SOLN
INTRAMUSCULAR | Status: DC | PRN
  Administered 2016-03-28: 1500 mL

## 2016-03-28 MED ORDER — VERAPAMIL HCL 2.5 MG/ML IV SOLN
INTRAVENOUS | Status: DC | PRN
  Administered 2016-03-28: 10 mL via INTRA_ARTERIAL

## 2016-03-28 MED ORDER — ASPIRIN 81 MG PO CHEW: 81.0000 mg | CHEWABLE_TABLET | ORAL | Status: DC

## 2016-03-28 MED ORDER — FENTANYL CITRATE (PF) 100 MCG/2ML IJ SOLN
INTRAMUSCULAR | Status: AC
  Filled 2016-03-28: qty 2

## 2016-03-28 MED ORDER — SODIUM CHLORIDE 0.9 % IV SOLN
INTRAVENOUS | Status: DC
  Administered 2016-03-28: 07:00:00 via INTRAVENOUS

## 2016-03-28 MED ORDER — HEPARIN (PORCINE) IN NACL 2-0.9 UNIT/ML-% IJ SOLN
INTRAMUSCULAR | Status: AC
  Filled 2016-03-28: qty 1500

## 2016-03-28 MED ORDER — SODIUM CHLORIDE 0.9% FLUSH: 3.0000 mL | INTRAVENOUS | Status: DC | PRN

## 2016-03-28 MED ORDER — METOPROLOL SUCCINATE ER 25 MG PO TB24: 25.0000 mg | ORAL_TABLET | Freq: Every day | ORAL | Status: DC

## 2016-03-28 MED ORDER — IOPAMIDOL (ISOVUE-370) INJECTION 76%
INTRAVENOUS | Status: AC
  Filled 2016-03-28: qty 50

## 2016-03-28 MED ORDER — VERAPAMIL HCL 2.5 MG/ML IV SOLN
INTRAVENOUS | Status: AC
  Filled 2016-03-28: qty 2

## 2016-03-28 MED ORDER — FENTANYL CITRATE (PF) 100 MCG/2ML IJ SOLN
INTRAMUSCULAR | Status: DC | PRN
  Administered 2016-03-28: 50 ug via INTRAVENOUS

## 2016-03-28 MED ORDER — ADENOSINE 12 MG/4ML IV SOLN
16.0000 mL | Freq: Once | INTRAVENOUS | Status: DC
  Filled 2016-03-28: qty 16

## 2016-03-28 MED ORDER — MIDAZOLAM HCL 2 MG/2ML IJ SOLN
INTRAMUSCULAR | Status: DC | PRN
  Administered 2016-03-28: 2 mg via INTRAVENOUS

## 2016-03-28 MED ORDER — ADENOSINE (DIAGNOSTIC) 140MCG/KG/MIN
INTRAVENOUS | Status: DC | PRN
  Administered 2016-03-28: 140 ug/kg/min via INTRAVENOUS

## 2016-03-28 MED ORDER — LIDOCAINE HCL (PF) 1 % IJ SOLN
INTRAMUSCULAR | Status: DC | PRN
  Administered 2016-03-28: 3 mL

## 2016-03-28 MED ORDER — IOPAMIDOL (ISOVUE-370) INJECTION 76%
INTRAVENOUS | Status: AC
  Filled 2016-03-28: qty 125

## 2016-03-28 MED ORDER — SODIUM CHLORIDE 0.9 % WEIGHT BASED INFUSION: 3.0000 mL/kg/h | INTRAVENOUS | Status: DC

## 2016-03-28 SURGICAL SUPPLY — 16 items
CATH INFINITI 5 FR JL3.5 (CATHETERS) ×2 IMPLANT
CATH INFINITI 5FR ANG PIGTAIL (CATHETERS) ×2 IMPLANT
CATH INFINITI JR4 5F (CATHETERS) ×2 IMPLANT
CATH MICROCATH NAVVUS (MICROCATHETER) ×1 IMPLANT
CATH VISTA GUIDE 6FR XBLAD3.5 (CATHETERS) ×2 IMPLANT
DEVICE RAD TR BAND REGULAR (VASCULAR PRODUCTS) ×2 IMPLANT
GLIDESHEATH SLEND A-KIT 6F 22G (SHEATH) ×2 IMPLANT
KIT ESSENTIALS PG (KITS) ×2 IMPLANT
KIT HEART LEFT (KITS) ×2 IMPLANT
MICROCATHETER NAVVUS (MICROCATHETER) ×2
PACK CARDIAC CATHETERIZATION (CUSTOM PROCEDURE TRAY) ×2 IMPLANT
SYR MEDRAD MARK V 150ML (SYRINGE) ×2 IMPLANT
TRANSDUCER W/STOPCOCK (MISCELLANEOUS) ×2 IMPLANT
TUBING CIL FLEX 10 FLL-RA (TUBING) ×2 IMPLANT
WIRE ASAHI PROWATER 180CM (WIRE) ×2 IMPLANT
WIRE SAFE-T 1.5MM-J .035X260CM (WIRE) ×2 IMPLANT

## 2016-03-28 NOTE — Interval H&P Note (Signed)
History and Physical Interval Note:  03/28/2016 8:19 AM  Daniel Rhodes  has presented today for surgery, with the diagnosis of abnormal stress and progressive angina.  The various methods of treatment have been discussed with the patient and family. After consideration of risks, benefits and other options for treatment, the patient has consented to  Procedure(s): Left Heart Cath and Coronary Angiography (N/A) With Possible Percutaneous Coronary Intervention as a surgical intervention .  The patient's history has been reviewed, patient examined, no change in status, stable for surgery.  I have reviewed the patient's chart and labs.  Questions were answered to the patient's satisfaction.    Cath Lab Visit (complete for each Cath Lab visit)  Clinical Evaluation Leading to the Procedure:   ACS: No.  Non-ACS:    Anginal Classification: CCS II  Anti-ischemic medical therapy: No Therapy  Non-Invasive Test Results: Equivocal test results  Prior CABG: No previous CABG  Unable to Accurately do AUC due to lack of available data - GXT done & reported as equivocal (but time, ST changes & symptoms are not reported).  HARDING, DAVID W

## 2016-03-28 NOTE — Discharge Instructions (Signed)
Radial Site Care °Refer to this sheet in the next few weeks. These instructions provide you with information about caring for yourself after your procedure. Your health care provider may also give you more specific instructions. Your treatment has been planned according to current medical practices, but problems sometimes occur. Call your health care provider if you have any problems or questions after your procedure. °WHAT TO EXPECT AFTER THE PROCEDURE °After your procedure, it is typical to have the following: °· Bruising at the radial site that usually fades within 1-2 weeks. °· Blood collecting in the tissue (hematoma) that may be painful to the touch. It should usually decrease in size and tenderness within 1-2 weeks. °HOME CARE INSTRUCTIONS °· Take medicines only as directed by your health care provider. °· You may shower 24-48 hours after the procedure or as directed by your health care provider. Remove the bandage (dressing) and gently wash the site with plain soap and water. Pat the area dry with a clean towel. Do not rub the site, because this may cause bleeding. °· Do not take baths, swim, or use a hot tub until your health care provider approves. °· Check your insertion site every day for redness, swelling, or drainage. °· Do not apply powder or lotion to the site. °· Do not flex or bend the affected arm for 24 hours or as directed by your health care provider. °· Do not push or pull heavy objects with the affected arm for 24 hours or as directed by your health care provider. °· Do not lift over 10 lb (4.5 kg) for 5 days after your procedure or as directed by your health care provider. °· Ask your health care provider when it is okay to: °¨ Return to work or school. °¨ Resume usual physical activities or sports. °¨ Resume sexual activity. °· Do not drive home if you are discharged the same day as the procedure. Have someone else drive you. °· You may drive 24 hours after the procedure unless otherwise  instructed by your health care provider. °· Do not operate machinery or power tools for 24 hours after the procedure. °· If your procedure was done as an outpatient procedure, which means that you went home the same day as your procedure, a responsible adult should be with you for the first 24 hours after you arrive home. °· Keep all follow-up visits as directed by your health care provider. This is important. °SEEK MEDICAL CARE IF: °· You have a fever. °· You have chills. °· You have increased bleeding from the radial site. Hold pressure on the site. °SEEK IMMEDIATE MEDICAL CARE IF: °· You have unusual pain at the radial site. °· You have redness, warmth, or swelling at the radial site. °· You have drainage (other than a small amount of blood on the dressing) from the radial site. °· The radial site is bleeding, and the bleeding does not stop after 30 minutes of holding steady pressure on the site. °· Your arm or hand becomes pale, cool, tingly, or numb. °  °This information is not intended to replace advice given to you by your health care provider. Make sure you discuss any questions you have with your health care provider. °  °Document Released: 01/10/2011 Document Revised: 12/29/2014 Document Reviewed: 06/26/2014 °Elsevier Interactive Patient Education ©2016 [employer] Inc[employer] ° °

## 2016-03-28 NOTE — H&P (View-Only) (Signed)
Cardiology Office Note  Date: 03/20/2016   ID: Daniel Rhodes, DOB 02-Jun-1951, MRN [id number]  PCP: Lindajo RoyalSanders, Kirk, MD  Consulting Cardiologist: Nona DellSamuel McDowell, MD   Chief Complaint  Patient presents with  . Abnormal GXT  . Shortness of Breath    History of Present Illness: Daniel Rhodes is a 65 y.o. male referred for cardiology consultation by Dr. Adrienne MochaKirk Saunders. He presents describing a 6 month history of decreased stamina and worsening dyspnea on exertion. He has been working on losing weight over the last several years, has lost over 30 pounds, but has felt like his typical activities have been more difficult despite this. He does not endorse exertional chest pain however. Symptoms are moderate and have been progressive recently. He was seen by his primary care provider and referred for a GXT which was done recently.  I received a faxed copy of the patient's GXT from March 24, very difficult to read. There is significant lead motion artifact, and difficulty interpreting the ST segments. The ST segment changes noted are equivocal, and not consistently noted throughout the tracing. Patient does not report having chest pain during the test. He states that the physician present was concerned about abnormal findings and stopped the test.  I reviewed his cardiac history. He underwent a cardiac catheterization in 2002 for chest pain, at that time finding normal coronary arteries. He does have reflux symptoms and recalls having a switch in his PPI at that point that was ultimately associated with his worsening chest pain.  ECG today in the office shows sinus rhythm with decreased anterior R-wave progression.  Today we discussed further evaluation of his symptoms in light of abnormal screening GXT. We discussed options including further noninvasive testing with better sensitivity and specificity, versus proceeding to a diagnostic cardiac catheterization for clear definition of his coronary  anatomy. After reviewing the risks and benefits, he is in agreement to proceed with cardiac catheterization.  Past Medical History  Diagnosis Date  . GERD (gastroesophageal reflux disease)   . BPH (benign prostatic hyperplasia)   . Osteoarthritis   . Diabetes mellitus with neuropathy (HCC)   . Essential hypertension   . History of cardiac catheterization     Normal coronaries 2002  . Hyperlipidemia   . Benign positional vertigo   . Iron deficiency anemia   . Statin intolerance     Past Surgical History  Procedure Laterality Date  . Cholecystectomy  2002  . Toe surgery  1963  . Rotator cuff repair Right 2008  . Wrist surgery Left     Current Outpatient Prescriptions  Medication Sig Dispense Refill  . aspirin 81 MG tablet Take 81 mg by mouth daily.    . cyclobenzaprine (FLEXERIL) 10 MG tablet Take 10 mg by mouth 3 (three) times daily as needed.    Marland Kitchen. esomeprazole (NEXIUM) 40 MG capsule Take 40 mg by mouth daily before breakfast.    . FISH OIL-KRILL OIL PO Take 50,000 mg by mouth once a week.    Marland Kitchen. glipiZIDE (GLUCOTROL XL) 10 MG 24 hr tablet Take 10 mg by mouth 2 (two) times daily.    . hydrochlorothiazide (HYDRODIURIL) 25 MG tablet Take 25 mg by mouth daily.    Marland Kitchen. ibuprofen (ADVIL,MOTRIN) 800 MG tablet Take 800 mg by mouth every 8 (eight) hours as needed.    . Liraglutide 18 MG/3ML SOPN Inject 18 mg into the skin at bedtime.    . metFORMIN (GLUCOPHAGE) 1000 MG tablet Take 1,000 mg  by mouth 2 (two) times daily with a meal.    . Tamsulosin HCl (FLOMAX) 0.4 MG CAPS Take by mouth daily after supper.     No current facility-administered medications for this visit.   Allergies:  Statins; Celecoxib; and Sulfonamide derivatives   Social History: The patient  reports that he has never smoked. He does not have any smokeless tobacco history on file. He reports that he drinks alcohol.   Family History: The patient's family history includes Heart attack in his father and mother.   ROS:   Please see the history of present illness. Otherwise, complete review of systems is positive for intermittent reflux.  All other systems are reviewed and negative.   Physical Exam: VS:  BP 116/74 mmHg  Pulse 84  Ht 5\' 9"  (1.753 m)  Wt 207 lb (93.895 kg)  BMI 30.55 kg/m2  SpO2 96%, BMI Body mass index is 30.55 kg/(m^2).  Wt Readings from Last 3 Encounters:  03/20/16 207 lb (93.895 kg)  12/11/10 235 lb (106.595 kg)    General: Overweight male, appears comfortable at rest. HEENT: Conjunctiva and lids normal, oropharynx clear. Neck: Supple, no elevated JVP or carotid bruits, no thyromegaly. Lungs: Clear to auscultation, nonlabored breathing at rest. Cardiac: Regular rate and rhythm, no S3 or significant systolic murmur, no pericardial rub. Abdomen: Soft, nontender, bowel sounds present, no guarding or rebound. Extremities: No pitting edema, distal pulses 2+. Skin: Warm and dry. Musculoskeletal: No kyphosis. Neuropsychiatric: Alert and oriented x3, affect grossly appropriate.  ECG: ECG is ordered today and reviewed above. No old tracing for comparison.  Recent Labwork:  March 2017: Hemoglobin A1c 6.8, hemoglobin 11.0, platelets 380, BUN 20, creatinine 1.1, potassium 4.4, AST 18, ALT 26  Other Studies Reviewed Today:  Cardiac catheterization 01/11/2001: CORONARY ANGIOGRAPHY: The left coronary artery arises and distributes normally.  The left main coronary artery is normal.  The left anterior descending artery has less than or equal to 10% mild irregularities proximally.  The left circumflex coronary artery is normal.  The right coronary artery is normal.  Left ventricular angiography in the RAO and LAO crania views demonstrates normal left ventricular size and contractility with normal systolic function. Ejection fraction is estimated at 65%. There is no significant mitral valve prolapse or regurgitation.  FINAL INTERPRETATION: 1. No significant atherosclerotic  coronary artery disease. 2. Normal left ventricular function.  Assessment and Plan:   1. Progressive exertional fatigue and shortness of breath over the last 6 months. Patient had a screening GXT done recently that showed equivocal findings. He had risk factors include age and gender, history of hyperlipidemia as well as hypertension and diabetes mellitus. Also some family history of premature CAD. He had a normal cardiac catheterization in 2002. We discussed options for further evaluation, and plan is to proceed with a diagnostic cardiac catheterization to most clearly outline his coronary anatomy. He is in agreement to proceed.  2. Essential hypertension, blood pressure is well controlled today. He is on hydrochlorothiazide.  3. History of hyperlipidemia with statin intolerance. He does take omega-3 supplements.  4. Type 2 diabetes mellitus, on Glucophage and Glucotrol XL. He follows with Dr. Lelon PerlaSaunders. Recent hemoglobin A1c was 6.8.  Current medicines were reviewed with the patient today.   Orders Placed This Encounter  Procedures  . DG Chest 2 View  . EKG 12-Lead    Disposition: FU with me after cardiac catheterization.  Signed, Jonelle SidleSamuel G. McDowell, MD, [facility] 03/20/2016 10:02 AM    [facility]  [facility] [place]. [address], [place], [place] [address] Phone: [phone]; Fax: [phone]

## 2016-04-11 ENCOUNTER — Ambulatory Visit (INDEPENDENT_AMBULATORY_CARE_PROVIDER_SITE_OTHER): Payer: Medicare Other | Admitting: Adult Health

## 2016-04-11 ENCOUNTER — Encounter: Payer: Self-pay | Admitting: Adult Health

## 2016-04-11 VITALS — BP 138/68 | HR 90 | Ht 69.0 in | Wt 208.0 lb

## 2016-04-11 DIAGNOSIS — I10 Essential (primary) hypertension: Secondary | ICD-10-CM | POA: Diagnosis not present

## 2016-04-11 DIAGNOSIS — I2510 Atherosclerotic heart disease of native coronary artery without angina pectoris: Secondary | ICD-10-CM

## 2016-04-11 MED ORDER — NITROGLYCERIN 0.4 MG SL SUBL: 0.4000 mg | SUBLINGUAL_TABLET | SUBLINGUAL | Status: DC | PRN

## 2016-04-11 MED ORDER — METOPROLOL SUCCINATE ER 25 MG PO TB24: 12.5000 mg | ORAL_TABLET | Freq: Every day | ORAL | Status: DC

## 2016-04-11 NOTE — Progress Notes (Deleted)
Name: Daniel Rhodes    DOB: 10/07/1951  Age: 65 y.o.  MR#: [id number]       PCP:  Lindajo Royal, MD      Insurance: Payor: Cleatrice Burke MEDICARE / Plan: [facility] MEDICARE / Product Type: *No Product type* /   CC:   No chief complaint on file.   VS Filed Vitals:   04/11/16 1530  BP: 138/68  Pulse: 90  Height:  (1.753 m)  Weight: 208 lb (94.348 kg)  SpO2: 98%    Weights Current Weight  04/11/16 208 lb (94.348 kg)  03/28/16 207 lb (93.895 kg)  03/20/16 207 lb (93.895 kg)    Blood Pressure  BP Readings from Last 3 Encounters:  04/11/16 138/68  03/28/16 139/78  03/20/16 116/74     Admit date:  (Not on file) Last encounter with RMR:  Visit date not found   Allergy Statins; Metoprolol; Celecoxib; Codeine; Oxycodone-acetaminophen; Simvastatin; Sulfonamide derivatives; and Sulfamethoxazole  Current Outpatient Prescriptions  Medication Sig Dispense Refill  . glipiZIDE (GLUCOTROL) 10 MG tablet Take 10 mg by mouth 2 (two) times daily before a meal.    . aspirin 81 MG tablet Take 81 mg by mouth daily.    . cyclobenzaprine (FLEXERIL) 10 MG tablet Take 10 mg by mouth at bedtime as needed for muscle spasms.     Marland Kitchen esomeprazole (NEXIUM) 40 MG capsule Take 40 mg by mouth at bedtime.     . hydrochlorothiazide (HYDRODIURIL) 25 MG tablet Take 25 mg by mouth daily.    Marland Kitchen ibuprofen (ADVIL,MOTRIN) 800 MG tablet Take 800 mg by mouth every 8 (eight) hours as needed for moderate pain.     . isosorbide mononitrate (IMDUR) 30 MG 24 hr tablet Take 1 tablet (30 mg total) by mouth daily. (Patient not taking: Reported on 04/11/2016) 30 tablet 3  . Liraglutide 18 MG/3ML SOPN Inject 1.8 mg into the skin at bedtime.     . metFORMIN (GLUCOPHAGE) 1000 MG tablet Take 1,000 mg by mouth 2 (two) times daily with a meal.    . Multiple Vitamins-Minerals (MULTIVITAMIN ADULTS 50+ PO) Take 1 tablet by mouth daily.    . Omega-3 Fatty Acids (FISH OIL) 500 MG CAPS Take 500 mg by mouth daily.    . Tamsulosin HCl  (FLOMAX) 0.4 MG CAPS Take 0.4 mg by mouth daily after supper.     . Vitamin D, Ergocalciferol, (DRISDOL) 50000 units CAPS capsule Take 50,000 Units by mouth every Wednesday.  1   No current facility-administered medications for this visit.    Discontinued Meds:    Medications Discontinued During This Encounter  Medication Reason  . glipiZIDE (GLUCOTROL XL) 10 MG 24 hr tablet Error  . metoprolol succinate (TOPROL XL) 25 MG 24 hr tablet Error    Patient Active Problem List   Diagnosis Date Noted  . Essential hypertension 03/28/2016  . Hyperlipidemia   . Diabetes mellitus with neuropathy (HCC)   . Abnormal stress ECG with treadmill   . Progressive angina (HCC) 03/24/2016  . Exertional dyspnea 03/24/2016  . Abnormal stress electrocardiogram test using treadmill 03/24/2016    LABS    Component Value Date/Time   NA 137 03/28/2016 0723   K 3.9 03/28/2016 0723   CL 99* 03/28/2016 0723   CO2 25 03/28/2016 0723   GLUCOSE 199* 03/28/2016 0723   BUN 19 03/28/2016 0723   CREATININE 1.05 03/28/2016 0723   CALCIUM 9.3 03/28/2016 0723   GFRNONAA >60 03/28/2016 0723   GFRAA >60 03/28/2016  0723   CMP     Component Value Date/Time   NA 137 03/28/2016 0723   K 3.9 03/28/2016 0723   CL 99* 03/28/2016 0723   CO2 25 03/28/2016 0723   GLUCOSE 199* 03/28/2016 0723   BUN 19 03/28/2016 0723   CREATININE 1.05 03/28/2016 0723   CALCIUM 9.3 03/28/2016 0723   GFRNONAA >60 03/28/2016 0723   GFRAA >60 03/28/2016 0723       Component Value Date/Time   WBC 7.1 03/28/2016 0723   HGB 10.9* 03/28/2016 0723   HCT 36.6* 03/28/2016 0723   MCV 73.2* 03/28/2016 0723    Lipid Panel  No results found for: CHOL, TRIG, HDL, CHOLHDL, VLDL, LDLCALC, LDLDIRECT  ABG No results found for: PHART, PCO2ART, PO2ART, HCO3, TCO2, ACIDBASEDEF, O2SAT   No results found for: TSH BNP (last 3 results) No results for input(s): BNP in the last 8760 hours.  ProBNP (last 3 results) No results for input(s):  PROBNP in the last 8760 hours.  Cardiac Panel (last 3 results) No results for input(s): CKTOTAL, CKMB, TROPONINI, RELINDX in the last 72 hours.  Iron/TIBC/Ferritin/ %Sat No results found for: IRON, TIBC, FERRITIN, IRONPCTSAT   EKG Orders placed or performed during the hospital encounter of 03/28/16  . EKG 12-Lead  . EKG 12-Lead     Prior Assessment and Plan Problem List as of 04/11/2016      Cardiovascular and Mediastinum   Essential hypertension   Progressive angina (HCC)     Endocrine   Diabetes mellitus with neuropathy (HCC)     Other   Hyperlipidemia   Exertional dyspnea   Abnormal stress electrocardiogram test using treadmill   Abnormal stress ECG with treadmill       Imaging: No results found.

## 2016-04-11 NOTE — Progress Notes (Signed)
Cardiology Office Note   Date:  04/11/2016   ID:  Daniel Rhodes, DOB Mar 09, 1951, MRN [id number]  PCP:  Lindajo Royal, MD  Cardiologist: McDowell/  Joni Reining, NP   No chief complaint on file.     History of Present Illness: Daniel Rhodes is a 65 y.o. male who presents for ongoing assessment and management ofcoronary artery disease. The patient had a cardiac catheterization on 03/28/2016,revealing an ostial LAD to proximal LAD lesion, 30% stenosis, mid LAD lesion 60% stenosis, distal LAD lesion 40% stenosed. FFR of 0.85. The left ventricular systolic function was found to be normal. FFR evaluation was negative for potential ischemia therefore decision was made not to proceed with PCI. He was continued on metoprolol. He is here for post procedure evaluation.  Patient did not tolerate Imdur causing excessive swelling, he also did not tolerate metoprolol 25 mg causing significant fatigue and dizziness. He normally walks over a mile or 2 a day and wasn't able to do that when she started this new medicine. The patient stopped it on his own and is feeling much better. He denies dyspnea or chest discomfort with exertion.   Past Medical History  Diagnosis Date  . GERD (gastroesophageal reflux disease)   . BPH (benign prostatic hyperplasia)   . Osteoarthritis   . Diabetes mellitus with neuropathy (HCC)   . Essential hypertension   . History of cardiac catheterization     Normal coronaries 2002  . Hyperlipidemia   . Benign positional vertigo   . Iron deficiency anemia   . Statin intolerance     Past Surgical History  Procedure Laterality Date  . Cholecystectomy  2002  . Toe surgery  1963  . Rotator cuff repair Right 2008  . Wrist surgery Left   . Cardiac catheterization N/A 03/28/2016    Procedure: Left Heart Cath and Coronary Angiography;  Surgeon: Marykay Lex, MD;  Location: [facility] INVASIVE CV LAB;  Service: Cardiovascular;  Laterality: N/A;  . Cardiac catheterization N/A  03/28/2016    Procedure: Intravascular Pressure Wire/FFR Study;  Surgeon: Marykay Lex, MD;  Location: [facility] INVASIVE CV LAB;  Service: Cardiovascular;  Laterality: N/A;     Current Outpatient Prescriptions  Medication Sig Dispense Refill  . glipiZIDE (GLUCOTROL) 10 MG tablet Take 10 mg by mouth 2 (two) times daily before a meal.    . aspirin 81 MG tablet Take 81 mg by mouth daily.    . cyclobenzaprine (FLEXERIL) 10 MG tablet Take 10 mg by mouth at bedtime as needed for muscle spasms.     Marland Kitchen esomeprazole (NEXIUM) 40 MG capsule Take 40 mg by mouth at bedtime.     . hydrochlorothiazide (HYDRODIURIL) 25 MG tablet Take 25 mg by mouth daily.    Marland Kitchen ibuprofen (ADVIL,MOTRIN) 800 MG tablet Take 800 mg by mouth every 8 (eight) hours as needed for moderate pain.     . isosorbide mononitrate (IMDUR) 30 MG 24 hr tablet Take 1 tablet (30 mg total) by mouth daily. (Patient not taking: Reported on 04/11/2016) 30 tablet 3  . Liraglutide 18 MG/3ML SOPN Inject 1.8 mg into the skin at bedtime.     . metFORMIN (GLUCOPHAGE) 1000 MG tablet Take 1,000 mg by mouth 2 (two) times daily with a meal.    . Multiple Vitamins-Minerals (MULTIVITAMIN ADULTS 50+ PO) Take 1 tablet by mouth daily.    . Omega-3 Fatty Acids (FISH OIL) 500 MG CAPS Take 500 mg by mouth daily.    . Tamsulosin HCl (  FLOMAX) 0.4 MG CAPS Take 0.4 mg by mouth daily after supper.     . Vitamin D, Ergocalciferol, (DRISDOL) 50000 units CAPS capsule Take 50,000 Units by mouth every Wednesday.  1   No current facility-administered medications for this visit.    Allergies:   Statins; Metoprolol; Celecoxib; Codeine; Oxycodone-acetaminophen; Simvastatin; Sulfonamide derivatives; and Sulfamethoxazole    Social History:  The patient  reports that he has never smoked. He does not have any smokeless tobacco history on file. He reports that he drinks alcohol.   Family History:  The patient's family history includes Heart attack in his father and mother.    ROS: All  other systems are reviewed and negative. Unless otherwise mentioned in H&P    PHYSICAL EXAM: VS:  BP 138/68 mmHg  Pulse 90  Ht 5\' 9"  (1.753 m)  Wt 208 lb (94.348 kg)  BMI 30.70 kg/m2  SpO2 98% , BMI Body mass index is 30.7 kg/(m^2). GEN: Well nourished, well developed, in no acute distress HEENT: normal Neck: no JVD, carotid bruits, or masses Cardiac: RRR; tachycardic, no murmurs, rubs, or gallops,no edema  Respiratory:  clear to auscultation bilaterally, normal work of breathing GI: soft, nontender, nondistended, + BS MS: no deformity or atrophy Skin: warm and dry, no rash Neuro:  Strength and sensation are intact Psych: euthymic mood, full affect  Recent Labs: 03/28/2016: BUN 19; Creatinine, Ser 1.05; Hemoglobin 10.9*; Platelets 348; Potassium 3.9; Sodium 137    Lipid Panel No results found for: CHOL, TRIG, HDL, CHOLHDL, VLDL, LDLCALC, LDLDIRECT    Wt Readings from Last 3 Encounters:  04/11/16 208 lb (94.348 kg)  03/28/16 207 lb (93.895 kg)  03/20/16 207 lb (93.895 kg)     ASSESSMENT AND PLAN:  1.  Coronary artery disease, the patient has nonobstructive disease in his LAD. There is no ischemia per FFR. The patient has felt worse on metoprolol and Imdur. He stopped taking it on his own and feels much better. Due to tachycardia and ask him to take metoprolol 12.5 mg at at bedtime only. I will stop Imdur, and given sublingual nitroglycerin when necessary. He will followup with Dr. Diona BrownerMcDowell in one month for reevaluation of medication response. [place] in [place] very close to [place]. He will be scheduled there.  2. Hypertension: pressure is well-controlled on current medication regimen which includes hydrochlorothiazide only. Can consider low-dose ACE in the setting of diabetes. However he is very well controlled at this time.  3. Diabetes: patient will continue his primary care physician for ongoing management.  Current medicines are reviewed  at length with the patient today.    Labs/ tests ordered today include:  No orders of the defined types were placed in this encounter.     Disposition:   FU with  Dr. Diona BrownerMcDowell one month   Signed, Joni ReiningKathryn Halimah Bewick, NP  04/11/2016 3:39 PM    [facility] [address], [place], [place] [address] Phone: [phone](336) [phone]; Fax: [phone](336) [phone] H. and

## 2016-04-11 NOTE — Patient Instructions (Signed)
Medication Instructions:  STOP ISOSORBIDE  TAKE METOPROLOL 12.5 MG ONCE DAILY  Labwork: NONE  Testing/Procedures: NONE  Follow-Up: Your physician recommends that you schedule a follow-up appointment in: 1 MONTH WITH DR. MCDOWELL    Any Other Special Instructions Will Be Listed Below (If Applicable).     If you need a refill on your cardiac medications before your next appointment, please call your pharmacy.

## 2016-04-15 ENCOUNTER — Ambulatory Visit: Payer: Self-pay | Admitting: Cardiology

## 2016-05-26 ENCOUNTER — Encounter: Payer: Self-pay | Admitting: Cardiology

## 2016-05-26 ENCOUNTER — Ambulatory Visit (INDEPENDENT_AMBULATORY_CARE_PROVIDER_SITE_OTHER): Payer: Medicare Other | Admitting: Cardiology

## 2016-05-26 VITALS — BP 138/80 | HR 82 | Ht 69.0 in | Wt 208.0 lb

## 2016-05-26 DIAGNOSIS — I2510 Atherosclerotic heart disease of native coronary artery without angina pectoris: Secondary | ICD-10-CM | POA: Diagnosis not present

## 2016-05-26 DIAGNOSIS — E785 Hyperlipidemia, unspecified: Secondary | ICD-10-CM | POA: Diagnosis not present

## 2016-05-26 DIAGNOSIS — I10 Essential (primary) hypertension: Secondary | ICD-10-CM | POA: Diagnosis not present

## 2016-05-26 NOTE — Patient Instructions (Signed)
Your physician recommends that you continue on your current medications as directed. Please refer to the Current Medication list given to you today. Your physician recommends that you schedule a follow-up appointment in: 6 months. You will receive a reminder letter in the mail in about 4 months reminding you to call and schedule your appointment. If you don't receive this letter, please contact our office. 

## 2016-05-26 NOTE — Progress Notes (Signed)
Cardiology Office Note  Date: 05/26/2016   ID: Daniel Rhodes, DOB 1951/02/11, MRN [id number]  PCP: Lindajo Royal, MD  Primary Cardiologist: Nona Dell, MD   Chief Complaint  Patient presents with  . Coronary Artery Disease    History of Present Illness: Daniel Rhodes is a 65 y.o. male last seen by Ms. Lawrence NP in April following diagnostic cardiac catheterization which revealed moderate LAD disease that was managed medically. He presents today for a follow-up visit, states that he has been doing very well. No angina symptoms. Working outdoors and also traveling to watch his granddaughter in various softball tournaments.  We discussed the results of his cardiac catheterization. Plan will be medical therapy and observation unless symptoms escalate. He is tolerating the current medication doses.  Past Medical History  Diagnosis Date  . GERD (gastroesophageal reflux disease)   . BPH (benign prostatic hyperplasia)   . Osteoarthritis   . Diabetes mellitus with neuropathy (HCC)   . Essential hypertension   . CAD (coronary artery disease)     60% LAD April 2017 - managed medically  . Hyperlipidemia   . Benign positional vertigo   . Iron deficiency anemia   . Statin intolerance     Current Outpatient Prescriptions  Medication Sig Dispense Refill  . aspirin 81 MG tablet Take 81 mg by mouth daily.    . cyclobenzaprine (FLEXERIL) 10 MG tablet Take 10 mg by mouth at bedtime as needed for muscle spasms.     Marland Kitchen esomeprazole (NEXIUM) 40 MG capsule Take 40 mg by mouth at bedtime.     Marland Kitchen glipiZIDE (GLUCOTROL) 10 MG tablet Take 10 mg by mouth 2 (two) times daily before a meal.    . hydrochlorothiazide (HYDRODIURIL) 25 MG tablet Take 25 mg by mouth daily.    Marland Kitchen ibuprofen (ADVIL,MOTRIN) 800 MG tablet Take 800 mg by mouth every 8 (eight) hours as needed for moderate pain.     . Liraglutide 18 MG/3ML SOPN Inject 1.8 mg into the skin at bedtime.     . metFORMIN (GLUCOPHAGE) 1000 MG  tablet Take 1,000 mg by mouth 2 (two) times daily with a meal.    . metoprolol succinate (TOPROL XL) 25 MG 24 hr tablet Take 0.5 tablets (12.5 mg total) by mouth daily. 30 tablet 6  . Multiple Vitamins-Minerals (MULTIVITAMIN ADULTS 50+ PO) Take 1 tablet by mouth daily.    . nitroGLYCERIN (NITROSTAT) 0.4 MG SL tablet Place 1 tablet (0.4 mg total) under the tongue every 5 (five) minutes as needed for chest pain. 25 tablet 3  . Omega-3 Fatty Acids (FISH OIL) 500 MG CAPS Take 500 mg by mouth daily.    . Tamsulosin HCl (FLOMAX) 0.4 MG CAPS Take 0.4 mg by mouth daily after supper.     . Vitamin D, Ergocalciferol, (DRISDOL) 50000 units CAPS capsule Take 50,000 Units by mouth every Wednesday.  1   No current facility-administered medications for this visit.   Allergies:  Statins; Celecoxib; Codeine; Oxycodone-acetaminophen; Simvastatin; Sulfonamide derivatives; and Sulfamethoxazole   Social History: The patient  reports that he has never smoked. He has quit using smokeless tobacco. His smokeless tobacco use included Chew. He reports that he drinks alcohol.   ROS:  Please see the history of present illness. Otherwise, complete review of systems is positive for none.  All other systems are reviewed and negative.   Physical Exam: VS:  BP 138/80 mmHg  Pulse 82  Ht  (1.753 m)  Wt 208  lb (94.348 kg)  BMI 30.70 kg/m2  SpO2 95%, BMI Body mass index is 30.7 kg/(m^2).  Wt Readings from Last 3 Encounters:  05/26/16 208 lb (94.348 kg)  04/11/16 208 lb (94.348 kg)  03/28/16 207 lb (93.895 kg)    General: Patient appears comfortable at rest. HEENT: Conjunctiva and lids normal, oropharynx clear with moist mucosa. Neck: Supple, no elevated JVP or carotid bruits, no thyromegaly. Lungs: Clear to auscultation, nonlabored breathing at rest. Cardiac: Regular rate and rhythm, no S3 or significant systolic murmur, no pericardial rub. Abdomen: Soft, nontender, no hepatomegaly, bowel sounds present, no  guarding or rebound. Extremities: No pitting edema, distal pulses 2+. Skin: Warm and dry.  ECG: I personally reviewed the tracing from 03/28/2016 which showed normal sinus rhythm.  Recent Labwork: 03/28/2016: BUN 19; Creatinine, Ser 1.05; Hemoglobin 10.9*; Platelets 348; Potassium 3.9; Sodium 137   Other Studies Reviewed Today:  Cardiac catheterization 03/28/2016: 1. Ost LAD to Prox LAD lesion, 30% stenosed. Mid LAD lesion, 60% stenosed. Dist LAD lesion, 40% stenosed. FFR 0.85 2. The left ventricular systolic function is normal.  The patient does have borderline LAD lesion with a focal 60% stenosis and booking 30 and 40% lesions on either side of D1. FFR evaluation was negative for potential ischemia. Therefore the decision was made not to proceed with PCI.  Assessment and Plan:  1. Moderate LAD disease as outlined above, plan to continue medical therapy and observation. He is symptomatically stable at this time.  2. Essential hypertension, continue current regimen which includes HCTZ and Toprol-XL.  3. Hyperlipidemia with statin intolerance. He continues on omega-3 supplements.  Current medicines were reviewed with the patient today.  Disposition: FU with me in 6 months.   Signed, Jonelle SidleSamuel G. McDowell, MD, [facility]ACC 05/26/2016 2:53 PM    [facility] [address], [place], [place] [address] Phone: [phone](336) [phone]; Fax: [phone]) [phone]

## 2016-10-23 ENCOUNTER — Other Ambulatory Visit: Payer: Self-pay | Admitting: Cardiology

## 2016-12-03 ENCOUNTER — Encounter: Payer: Self-pay | Admitting: *Deleted

## 2016-12-03 NOTE — Progress Notes (Signed)
Cardiology Office Note  Date: 12/04/2016   ID: Daniel Rhodes, DOB 01-17-1951, MRN [id number]  PCP: Lindajo RoyalSanders, Kirk, MD  Primary Cardiologist: Nona DellSamuel Jamia Hoban, MD   Chief Complaint  Patient presents with  . Coronary Artery Disease    History of Present Illness: Daniel Rhodes is a 65 y.o. male last seen in June. He presents for a routine follow-up visit. Continues to do well, does not report any angina symptoms or nitroglycerin use. Has been somewhat less active over the winter.  I reviewed his most recent lab work which is outlined below. Current cardiac regimen includes aspirin, HCTZ, Toprol-XL, omega-3 supplements, and as needed nitroglycerin.  Past Medical History:  Diagnosis Date  . Benign positional vertigo   . BPH (benign prostatic hyperplasia)   . CAD (coronary artery disease)    60% LAD April 2017 - managed medically  . Diabetes mellitus with neuropathy (HCC)   . Essential hypertension   . GERD (gastroesophageal reflux disease)   . Hyperlipidemia   . Iron deficiency anemia   . Osteoarthritis   . Statin intolerance     Current Outpatient Prescriptions  Medication Sig Dispense Refill  . aspirin 81 MG tablet Take 81 mg by mouth daily.    Marland Kitchen. CINNAMON PO Take 1,000 mg by mouth 2 (two) times daily.    . cyclobenzaprine (FLEXERIL) 10 MG tablet Take 10 mg by mouth at bedtime as needed for muscle spasms.     Marland Kitchen. esomeprazole (NEXIUM) 40 MG capsule Take 40 mg by mouth at bedtime.     Marland Kitchen. glipiZIDE (GLUCOTROL) 10 MG tablet Take 10 mg by mouth 2 (two) times daily before a meal.    . hydrochlorothiazide (HYDRODIURIL) 25 MG tablet Take 25 mg by mouth daily.    Marland Kitchen. ibuprofen (ADVIL,MOTRIN) 800 MG tablet Take 800 mg by mouth every 8 (eight) hours as needed for moderate pain.     . Liraglutide 18 MG/3ML SOPN Inject 1.8 mg into the skin at bedtime.     . Melatonin ([employer] MELATONIN) 3 MG TABS Take 1 tablet by mouth at bedtime.    . metFORMIN (GLUCOPHAGE) 1000 MG tablet Take 1,000 mg by  mouth 2 (two) times daily with a meal.    . metoprolol succinate (TOPROL XL) 25 MG 24 hr tablet Take 0.5 tablets (12.5 mg total) by mouth daily. 30 tablet 6  . Multiple Vitamins-Minerals (MULTIVITAMIN ADULTS 50+ PO) Take 1 tablet by mouth daily.    . nitroGLYCERIN (NITROSTAT) 0.4 MG SL tablet Place 1 tablet (0.4 mg total) under the tongue every 5 (five) minutes as needed for chest pain. 25 tablet 3  . Omega-3 Fatty Acids (FISH OIL) 500 MG CAPS Take 500 mg by mouth daily.    . Tamsulosin HCl (FLOMAX) 0.4 MG CAPS Take 0.4 mg by mouth daily after supper.     . testosterone cypionate (DEPOTESTOSTERONE CYPIONATE) 200 MG/ML injection Inject 1 mL into the muscle every 14 (fourteen) days.    . Vitamin D, Ergocalciferol, (DRISDOL) 50000 units CAPS capsule Take 50,000 Units by mouth every Wednesday.  1   No current facility-administered medications for this visit.    Allergies:  Statins; Celecoxib; Codeine; Oxycodone-acetaminophen; Simvastatin; Sulfonamide derivatives; and Sulfamethoxazole   Social History: The patient  reports that he has never smoked. He quit smokeless tobacco use about 10 years ago. His smokeless tobacco use included Chew. He reports that he drinks alcohol.   ROS:  Please see the history of present illness. Otherwise, complete review  of systems is positive for none.  All other systems are reviewed and negative.   Physical Exam: VS:  BP 112/74   Pulse 85   Ht 5\' 9"  (1.753 m)   Wt 208 lb (94.3 kg)   BMI 30.72 kg/m , BMI Body mass index is 30.72 kg/m.  Wt Readings from Last 3 Encounters:  12/04/16 208 lb (94.3 kg)  05/26/16 208 lb (94.3 kg)  04/11/16 208 lb (94.3 kg)    General: Patient appears comfortable at rest. HEENT: Conjunctiva and lids normal, oropharynx clear with moist mucosa. Neck: Supple, no elevated JVP or carotid bruits, no thyromegaly. Lungs: Clear to auscultation, nonlabored breathing at rest. Cardiac: Regular rate and rhythm, no S3 or significant systolic  murmur, no pericardial rub. Abdomen: Soft, nontender, no hepatomegaly, bowel sounds present, no guarding or rebound. Extremities: No pitting edema, distal pulses 2+. Skin: Warm and dry.  ECG: I personally reviewed the tracing from 03/28/2016 which showed normal sinus rhythm.  Recent Labwork:  November 2017: Hemoglobin 10.2, platelets 337, BUN 23, creatinine 1.3, potassium 4.1, AST 16, ALT 19, hemoglobin A1c 7.23 May 2016: Cholesterol 211, triglycerides 146, HDL 49, LDL 133  Other Studies Reviewed Today:  Cardiac catheterization 03/28/2016: 1. Ost LAD to Prox LAD lesion, 30% stenosed. Mid LAD lesion, 60% stenosed. Dist LAD lesion, 40% stenosed. FFR 0.85 2. The left ventricular systolic function is normal.  The patient does have borderline LAD lesion with a focal 60% stenosis and booking 30 and 40% lesions on either side of D1. FFR evaluation was negative for potential ischemia. Therefore the decision was made not to proceed with PCI.  Assessment and Plan:  1. Moderate CAD in the LAD distribution, submitted medically stable with plan to continue medical therapy. No changes were made today.  2. Essential hypertension, blood pressure is well controlled today.  Current medicines were reviewed with the patient today.   Disposition: Follow-up in 6 months, sooner if needed.  Signed, Jonelle SidleSamuel G. Callum Wolf, MD, [facility]ACC 12/04/2016 11:30 AM    [facility] [address], [place], [place] [address] Phone: [phone]([phone]; Fax: [phone]

## 2016-12-04 ENCOUNTER — Encounter (INDEPENDENT_AMBULATORY_CARE_PROVIDER_SITE_OTHER): Payer: Self-pay

## 2016-12-04 ENCOUNTER — Ambulatory Visit (INDEPENDENT_AMBULATORY_CARE_PROVIDER_SITE_OTHER): Payer: Medicare Other | Admitting: Cardiology

## 2016-12-04 ENCOUNTER — Encounter: Payer: Self-pay | Admitting: Cardiology

## 2016-12-04 VITALS — BP 112/74 | HR 85 | Ht 69.0 in | Wt 208.0 lb

## 2016-12-04 DIAGNOSIS — I10 Essential (primary) hypertension: Secondary | ICD-10-CM

## 2016-12-04 DIAGNOSIS — I2510 Atherosclerotic heart disease of native coronary artery without angina pectoris: Secondary | ICD-10-CM | POA: Diagnosis not present

## 2016-12-04 NOTE — Patient Instructions (Signed)

## 2017-02-13 ENCOUNTER — Encounter: Payer: Self-pay | Admitting: Cardiology

## 2017-06-01 NOTE — Progress Notes (Signed)
Cardiology Office Note  Date: 06/03/2017   ID: Daniel Rhodes, DOB 07-22-1951, MRN [id number]  PCP: Lindajo RoyalSanders, Kirk, MD  Primary Cardiologist: Nona DellSamuel McDowell, MD   Chief Complaint  Patient presents with  . Coronary Artery Disease    History of Present Illness: Daniel Rhodes is a 66 y.o. male last seen in December 2017. He presents for a routine follow-up visit. Reports no angina symptoms or progressive shortness of breath with typical activity. He enjoys working outdoors on his property. He has [place] acres.  Cardiac catheterization results from April 2017 are reviewed below. I went over the results with him again today. He does not report any change in stamina. He continues on aspirin and beta blocker, has statin intolerance. He has not required any nitroglycerin. I personally reviewed his ECG today which shows sinus rhythm with PVC and increased voltage.  He states that he is in the process of referral for hematology evaluation due to iron deficiency anemia with negative GI workup. Lab work is reviewed below.  Past Medical History:  Diagnosis Date  . Benign positional vertigo   . BPH (benign prostatic hyperplasia)   . CAD (coronary artery disease)    60% LAD April 2017 - managed medically  . Diabetes mellitus with neuropathy (HCC)   . Essential hypertension   . GERD (gastroesophageal reflux disease)   . Hyperlipidemia   . Iron deficiency anemia   . Osteoarthritis   . Statin intolerance     Past Surgical History:  Procedure Laterality Date  . CARDIAC CATHETERIZATION N/A 03/28/2016   Procedure: Left Heart Cath and Coronary Angiography;  Surgeon: Marykay Lexavid W Harding, MD;  [facility];  Service: Cardiovascular;  Laterality: N/A;  . CARDIAC CATHETERIZATION N/A 03/28/2016   Procedure: Intravascular Pressure Wire/FFR Study;  Surgeon: Marykay Lexavid W Harding, MD;  [facility];  Service: Cardiovascular;  Laterality: N/A;  . CHOLECYSTECTOMY  2002  . ROTATOR CUFF  REPAIR Right 2008  . TOE SURGERY  1963  . WRIST SURGERY Left     Current Outpatient Prescriptions  Medication Sig Dispense Refill  . aspirin 81 MG tablet Take 81 mg by mouth daily.    Marland Kitchen. CINNAMON PO Take 1,000 mg by mouth 2 (two) times daily.    . cyclobenzaprine (FLEXERIL) 10 MG tablet Take 10 mg by mouth at bedtime as needed for muscle spasms.     Marland Kitchen. esomeprazole (NEXIUM) 40 MG capsule Take 40 mg by mouth at bedtime.     Marland Kitchen. glipiZIDE (GLUCOTROL) 10 MG tablet Take 10 mg by mouth 2 (two) times daily before a meal.    . hydrochlorothiazide (HYDRODIURIL) 25 MG tablet Take 25 mg by mouth daily.    Marland Kitchen. ibuprofen (ADVIL,MOTRIN) 800 MG tablet Take 800 mg by mouth every 8 (eight) hours as needed for moderate pain.     . Liraglutide 18 MG/3ML SOPN Inject 1.8 mg into the skin at bedtime.     . Melatonin ([employer] MELATONIN) 3 MG TABS Take 2 tablets by mouth at bedtime.     . metFORMIN (GLUCOPHAGE) 1000 MG tablet Take 1,000 mg by mouth 2 (two) times daily with a meal.    . metoprolol succinate (TOPROL XL) 25 MG 24 hr tablet Take 0.5 tablets (12.5 mg total) by mouth daily. 30 tablet 6  . Multiple Vitamins-Minerals (MULTIVITAMIN ADULTS 50+ PO) Take 1 tablet by mouth daily.    . nitroGLYCERIN (NITROSTAT) 0.4 MG SL tablet Place 1 tablet (0.4 mg total)  under the tongue every 5 (five) minutes as needed for chest pain. 25 tablet 3  . Omega-3 Fatty Acids (FISH OIL) 500 MG CAPS Take 500 mg by mouth daily.    . Tamsulosin HCl (FLOMAX) 0.4 MG CAPS Take 0.4 mg by mouth daily after supper.     . Vitamin D, Ergocalciferol, (DRISDOL) 50000 units CAPS capsule Take 50,000 Units by mouth every Wednesday.  1   No current facility-administered medications for this visit.    Allergies:  Statins; Celecoxib; Codeine; Oxycodone-acetaminophen; Simvastatin; Sulfonamide derivatives; and Sulfamethoxazole   Social History: The patient  reports that he has never smoked. He quit smokeless tobacco use about 11 years ago. His smokeless  tobacco use included Chew. He reports that he drinks alcohol.   ROS:  Please see the history of present illness. Otherwise, complete review of systems is positive for none.  All other systems are reviewed and negative.   Physical Exam: VS:  BP 126/78   Pulse 75   Ht 5\' 9"  (1.753 m)   Wt 200 lb (90.7 kg)   SpO2 99%   BMI 29.53 kg/m , BMI Body mass index is 29.53 kg/m.  Wt Readings from Last 3 Encounters:  06/03/17 200 lb (90.7 kg)  12/04/16 208 lb (94.3 kg)  05/26/16 208 lb (94.3 kg)    General: Patient appears comfortable at rest. HEENT: Conjunctiva and lids normal, oropharynx clear with moist mucosa. Neck: Supple, no elevated JVP or carotid bruits, no thyromegaly. Lungs: Clear to auscultation, nonlabored breathing at rest. Cardiac: Regular rate and rhythm, no S3 or significant systolic murmur, no pericardial rub. Abdomen: Soft, nontender, no hepatomegaly, bowel sounds present, no guarding or rebound. Extremities: No pitting edema, distal pulses 2+. Skin: Warm and dry. Musculoskeletal: No kyphosis. Neuropsychiatric: Alert and oriented 3, affect appropriate.  ECG: I personally reviewed the tracing from 03/28/2016 which showed normal sinus rhythm.  Recent Labwork:  June 2017: Cholesterol 211, triglycerides 146, HDL 49, LDL 133 May 2018: Iron 27, iron saturation 6, hemoglobin 9.3, MCV 65, platelets 395, hemoglobin A1c 6.9, BUN 19, creatinine 1.0, potassium 4.1, AST 22, ALT 21  Other Studies Reviewed Today:  Cardiac catheterization 03/28/2016: 1. Ost LAD to Prox LAD lesion, 30% stenosed. Mid LAD lesion, 60% stenosed. Dist LAD lesion, 40% stenosed. FFR 0.85 2. The left ventricular systolic function is normal.  The patient does have borderline LAD lesion with a focal 60% stenosis and booking 30 and 40% lesions on either side of D1. FFR evaluation was negative for potential ischemia. Therefore the decision was made not to proceed with PCI.  Assessment and Plan:  1. Moderate mid  LAD disease, currently asymptomatic and with plan to continue medical therapy. FFR assessment at cardiac catheterization last year was negative for hemodynamic significance. He continues on aspirin and beta blocker, has nitroglycerin available.  2. Hyperlipidemia with statin intolerance. LDL was 133 last year. He is working hard on diet and weight loss.  3. Essential hypertension, systolic blood pressure 120s today. No changes made to current regimen.  4. Iron deficiency anemia with hematology evaluation pending.  Current medicines were reviewed with the patient today.   Orders Placed This Encounter  Procedures  . EKG 12-Lead    Disposition: Follow-up in one year, sooner if needed.  Signed, Jonelle Sidle, MD, [facility] 06/03/2017 9:06 AM    [facility] at [facility] [address] [place], [place], [place] [address] Phone: [phone]; Fax: [phone]

## 2017-06-03 ENCOUNTER — Encounter: Payer: Self-pay | Admitting: Cardiology

## 2017-06-03 ENCOUNTER — Ambulatory Visit (INDEPENDENT_AMBULATORY_CARE_PROVIDER_SITE_OTHER): Payer: Medicare Other | Admitting: Cardiology

## 2017-06-03 VITALS — BP 126/78 | HR 75 | Ht 69.0 in | Wt 200.0 lb

## 2017-06-03 DIAGNOSIS — I2510 Atherosclerotic heart disease of native coronary artery without angina pectoris: Secondary | ICD-10-CM

## 2017-06-03 DIAGNOSIS — Z789 Other specified health status: Secondary | ICD-10-CM | POA: Diagnosis not present

## 2017-06-03 DIAGNOSIS — D509 Iron deficiency anemia, unspecified: Secondary | ICD-10-CM

## 2017-06-03 DIAGNOSIS — E782 Mixed hyperlipidemia: Secondary | ICD-10-CM | POA: Diagnosis not present

## 2017-06-03 NOTE — Patient Instructions (Signed)

## 2017-06-16 ENCOUNTER — Telehealth: Payer: Self-pay

## 2017-06-16 NOTE — Telephone Encounter (Signed)
-----   Message from Ok Anishristopher R Berge, NP sent at 06/16/2017  8:02 AM EDT ----- Labs from Feb reviewed.  LDL 104.  LFTs ok.  He is not on statin due to prior intolerance.  With h/o CAD, if he is willing to try, I would add zetia 10 mg daily.

## 2017-06-16 NOTE — Telephone Encounter (Signed)
Patient notified of recommendation and refused the zetia. Routed to PCP

## 2017-06-18 DIAGNOSIS — E559 Vitamin D deficiency, unspecified: Secondary | ICD-10-CM | POA: Insufficient documentation

## 2017-06-28 ENCOUNTER — Other Ambulatory Visit: Payer: Self-pay | Admitting: Internal Medicine

## 2017-07-14 ENCOUNTER — Other Ambulatory Visit: Payer: Self-pay

## 2017-07-14 MED ORDER — METOPROLOL SUCCINATE ER 25 MG PO TB24: 12.5000 mg | ORAL_TABLET | Freq: Every day | ORAL | 6 refills | Status: DC

## 2018-02-18 ENCOUNTER — Encounter: Payer: Self-pay | Admitting: Cardiology

## 2018-05-19 ENCOUNTER — Encounter: Payer: Self-pay | Admitting: Cardiology

## 2018-06-15 NOTE — Progress Notes (Signed)
Cardiology Office Note  Date: 06/16/2018   ID: Daniel Rhodes, DOB [birth year], MRN [id number]  PCP: Lindajo Royal, MD  Primary Cardiologist: Nona Dell, MD   Chief Complaint  Patient presents with  . Coronary Artery Disease    History of Present Illness: Daniel Rhodes is a 67 y.o. male last seen in June 2018.  He is here for a routine visit.  Since last assessment he does not report any significant change in stamina, no angina symptoms or increasing shortness of breath with typical activities, no palpitations or syncope.  He has not been exercising regularly other than doing yard work and playing golf occasionally.  We talked about a walking plan.  I reviewed his medications.  Current cardiac regimen includes aspirin, HCTZ, Toprol-XL, omega-3 supplements, and as needed nitroglycerin.  He has a known statin intolerance.  I am requesting his lipid panel from December 2018.  Previous LDL was 104.  We talked about trying Zetia today.  I reviewed recent lab work from PCP.  I personally reviewed his ECG today which shows sinus rhythm with low voltage in the precordial leads and poor R wave progression.  Past Medical History:  Diagnosis Date  . Benign positional vertigo   . BPH (benign prostatic hyperplasia)   . CAD (coronary artery disease)    60% LAD April 2017 - managed medically  . Diabetes mellitus with neuropathy (HCC)   . Essential hypertension   . GERD (gastroesophageal reflux disease)   . Hyperlipidemia   . Iron deficiency anemia   . Osteoarthritis   . Statin intolerance     Past Surgical History:  Procedure Laterality Date  . CARDIAC CATHETERIZATION N/A 03/28/2016   Procedure: Left Heart Cath and Coronary Angiography;  Surgeon: Marykay Lex, MD;  Location: [facility] INVASIVE CV LAB;  Service: Cardiovascular;  Laterality: N/A;  . CARDIAC CATHETERIZATION N/A 03/28/2016   Procedure: Intravascular Pressure Wire/FFR Study;  Surgeon: Marykay Lex, MD;  Location: [facility]  INVASIVE CV LAB;  Service: Cardiovascular;  Laterality: N/A;  . CHOLECYSTECTOMY  2002  . ROTATOR CUFF REPAIR Right 2008  . TOE SURGERY  1963  . WRIST SURGERY Left     Current Outpatient Medications  Medication Sig Dispense Refill  . aspirin 81 MG tablet Take 81 mg by mouth daily.    Marland Kitchen CINNAMON PO Take 1,000 mg by mouth 2 (two) times daily.    . cyclobenzaprine (FLEXERIL) 10 MG tablet Take 10 mg by mouth at bedtime as needed for muscle spasms.     Marland Kitchen esomeprazole (NEXIUM) 40 MG capsule Take 40 mg by mouth at bedtime.     Marland Kitchen glipiZIDE (GLUCOTROL) 10 MG tablet Take 10 mg by mouth 2 (two) times daily before a meal.    . hydrochlorothiazide (HYDRODIURIL) 25 MG tablet Take 25 mg by mouth daily.    Marland Kitchen ibuprofen (ADVIL,MOTRIN) 800 MG tablet Take 800 mg by mouth every 8 (eight) hours as needed for moderate pain.     . Liraglutide 18 MG/3ML SOPN Inject 1.8 mg into the skin at bedtime.     . Melatonin ([employer]S MELATONIN) 3 MG TABS Take 2 tablets by mouth at bedtime.     . metFORMIN (GLUCOPHAGE) 1000 MG tablet Take 1,000 mg by mouth 2 (two) times daily with a meal.    . metoprolol succinate (TOPROL XL) 25 MG 24 hr tablet Take 0.5 tablets (12.5 mg total) by mouth daily. 30 tablet 6  . Multiple Vitamins-Minerals (MULTIVITAMIN ADULTS 50+ PO)  Take 1 tablet by mouth daily.    . nitroGLYCERIN (NITROSTAT) 0.4 MG SL tablet Place 1 tablet (0.4 mg total) under the tongue every 5 (five) minutes as needed for chest pain. 25 tablet 3  . Omega-3 Fatty Acids (FISH OIL) 500 MG CAPS Take 500 mg by mouth daily.    . Tamsulosin HCl (FLOMAX) 0.4 MG CAPS Take 0.4 mg by mouth daily after supper.     . Vitamin D, Ergocalciferol, (DRISDOL) 50000 units CAPS capsule Take 50,000 Units by mouth every Wednesday.  1  . ezetimibe (ZETIA) 10 MG tablet Take 1 tablet (10 mg total) by mouth daily. 90 tablet 0   No current facility-administered medications for this visit.    Allergies:  Statins; Celecoxib; Codeine; Oxycodone-acetaminophen;  Simvastatin; Sulfonamide derivatives; and Sulfamethoxazole   Social History: The patient  reports that he has never smoked. He quit smokeless tobacco use about 12 years ago. His smokeless tobacco use included chew. He reports that he drinks alcohol.   ROS:  Please see the history of present illness. Otherwise, complete review of systems is positive for none.  All other systems are reviewed and negative.   Physical Exam: VS:  BP 140/70   Pulse 87   Ht 5\' 9"  (1.753 m)   Wt 199 lb (90.3 kg)   SpO2 97%   BMI 29.39 kg/m , BMI Body mass index is 29.39 kg/m.  Wt Readings from Last 3 Encounters:  06/16/18 199 lb (90.3 kg)  06/03/17 200 lb (90.7 kg)  12/04/16 208 lb (94.3 kg)    General: Patient appears comfortable at rest. HEENT: Conjunctiva and lids normal, oropharynx clear. Neck: Supple, no elevated JVP or carotid bruits, no thyromegaly. Lungs: Clear to auscultation, nonlabored breathing at rest. Cardiac: Regular rate and rhythm, no S3 or significant systolic murmur, no pericardial rub. Abdomen: Soft, nontender, bowel sounds present. Extremities: No pitting edema, distal pulses 2+. Skin: Warm and dry. Musculoskeletal: No kyphosis. Neuropsychiatric: Alert and oriented x3, affect grossly appropriate.  ECG: I personally reviewed the tracing from 06/03/2017 which showed sinus rhythm with PVC and increased voltage.  Recent Labwork:  February 2018: Hemoglobin A1c 7.1, hemoglobin 9.3, platelets 373, cholesterol 171, triglycerides 178, HDL 31, LDL 104, BUN 22, creatinine 1.14, potassium 4.2, AST 25, ALT 18 March 2018: Hemoglobin 15.0, platelets 266, BUN 15, creatinine 1.01, potassium 4.0, AST 32, ALT 47, hemoglobin A1c 6.27 Apr 2018: Hemoglobin 14.3, platelets 260, BUN 12, creatinine 0.95, potassium 4.3, AST 29, ALT 41, hemoglobin A1c 7.2  Other Studies Reviewed Today:  Cardiac catheterization 03/28/2016: 1. Ost LAD to Prox LAD lesion, 30% stenosed. Mid LAD lesion, 60% stenosed. Dist LAD  lesion, 40% stenosed. FFR 0.85 2. The left ventricular systolic function is normal.  The patient does have borderline LAD lesion with a focal 60% stenosis and booking 30 and 40% lesions on either side of D1. FFR evaluation was negative for potential ischemia. Therefore the decision was made not to proceed with PCI.  Assessment and Plan:  1.  Moderate mid LAD disease, nonobstructive by FFR assessment at cardiac catheterization in 2017.  He does not report any angina symptoms at this time and we will plan to continue medical therapy and observation.  I reviewed his ECG.  We discussed warning signs and symptoms.  2.  Mixed hyperlipidemia with known statin intolerance.  I am requesting his most recent lipid panel from PCP, previously LDL had come down to 104 with dietary measures.  We are going to add Zetia 10  mg daily as well and recheck lipid panel in 3 months.  3.  Type 2 diabetes mellitus, most recent hemoglobin A1c 7.2.  Continues to follow with PCP.  4.  Essential hypertension, no changes made to present regimen.  Continue HCTZ and metoprolol.  Current medicines were reviewed with the patient today.   Orders Placed This Encounter  Procedures  . Lipid Profile  . EKG 12-Lead    Disposition: Follow-up in 1 year, sooner if needed.  Signed, Jonelle Sidle, MD, [facility] 06/16/2018 9:02 AM    [facility] at [facility] [address] [place], [place], [place] [address] Phone: [phone]; Fax: [phone]

## 2018-06-16 ENCOUNTER — Ambulatory Visit: Payer: Medicare Other | Admitting: Cardiology

## 2018-06-16 ENCOUNTER — Encounter: Payer: Self-pay | Admitting: Cardiology

## 2018-06-16 VITALS — BP 140/70 | HR 87 | Ht 69.0 in | Wt 199.0 lb

## 2018-06-16 DIAGNOSIS — I25119 Atherosclerotic heart disease of native coronary artery with unspecified angina pectoris: Secondary | ICD-10-CM

## 2018-06-16 DIAGNOSIS — I10 Essential (primary) hypertension: Secondary | ICD-10-CM

## 2018-06-16 DIAGNOSIS — E782 Mixed hyperlipidemia: Secondary | ICD-10-CM

## 2018-06-16 DIAGNOSIS — E1165 Type 2 diabetes mellitus with hyperglycemia: Secondary | ICD-10-CM

## 2018-06-16 DIAGNOSIS — Z789 Other specified health status: Secondary | ICD-10-CM

## 2018-06-16 MED ORDER — NITROGLYCERIN 0.4 MG SL SUBL: 0.4000 mg | SUBLINGUAL_TABLET | SUBLINGUAL | 3 refills | Status: DC | PRN

## 2018-06-16 MED ORDER — EZETIMIBE 10 MG PO TABS: 10.0000 mg | ORAL_TABLET | Freq: Every day | ORAL | 0 refills | Status: DC

## 2018-06-16 NOTE — Patient Instructions (Signed)
Medication Instructions:  Your physician has recommended you make the following change in your medication:   START Zetia 10 mg daily   Please continue all other medications as prescribed  Labwork:  LIPIDS  In 3 months   Orders given today   Testing/Procedures: NONE  Follow-Up: Your physician wants you to follow-up in: 1 YEAR WITH DR. Diona BrownerMCDOWELL. You will receive a reminder letter in the mail two months in advance. If you don't receive a letter, please call our office to schedule the follow-up appointment.  Any Other Special Instructions Will Be Listed Below (If Applicable).  If you need a refill on your cardiac medications before your next appointment, please call your pharmacy.

## 2018-07-28 ENCOUNTER — Other Ambulatory Visit: Payer: Self-pay | Admitting: Cardiology

## 2018-09-10 ENCOUNTER — Other Ambulatory Visit: Payer: Self-pay | Admitting: Cardiology

## 2018-09-15 ENCOUNTER — Telehealth: Payer: Self-pay | Admitting: Cardiology

## 2018-09-15 NOTE — Telephone Encounter (Signed)
Patient called stating that medication (ZETIA) 10 MG tablet is not working for him. States that he has stopped the medication . Please call [phone].

## 2018-09-15 NOTE — Telephone Encounter (Signed)
Patient c/o pain in every joint above his waist especially his hands since starting zetia and he has stopped taking this medications. Patient advised to contact our office in a couple of weeks if the pain continues after stopping the zetia.

## 2019-07-21 ENCOUNTER — Telehealth: Payer: Self-pay | Admitting: *Deleted

## 2019-07-21 NOTE — Telephone Encounter (Signed)
Patient verbally consented for telehealth visits with [facility] and understands that his insurance company will be billed for the encounter.   Did not ask about vitals

## 2019-07-26 ENCOUNTER — Encounter: Payer: Self-pay | Admitting: Cardiology

## 2019-07-26 ENCOUNTER — Telehealth (INDEPENDENT_AMBULATORY_CARE_PROVIDER_SITE_OTHER): Payer: Medicare Other | Admitting: Cardiology

## 2019-07-26 VITALS — Ht 69.0 in | Wt 176.0 lb

## 2019-07-26 DIAGNOSIS — I25119 Atherosclerotic heart disease of native coronary artery with unspecified angina pectoris: Secondary | ICD-10-CM

## 2019-07-26 DIAGNOSIS — E1140 Type 2 diabetes mellitus with diabetic neuropathy, unspecified: Secondary | ICD-10-CM | POA: Diagnosis not present

## 2019-07-26 DIAGNOSIS — E782 Mixed hyperlipidemia: Secondary | ICD-10-CM

## 2019-07-26 DIAGNOSIS — I10 Essential (primary) hypertension: Secondary | ICD-10-CM

## 2019-07-26 MED ORDER — METOPROLOL SUCCINATE ER 25 MG PO TB24: ORAL_TABLET | ORAL | 3 refills | Status: DC

## 2019-07-26 MED ORDER — NITROGLYCERIN 0.4 MG SL SUBL: 0.4000 mg | SUBLINGUAL_TABLET | SUBLINGUAL | 3 refills | Status: DC | PRN

## 2019-07-26 NOTE — Patient Instructions (Addendum)

## 2019-07-26 NOTE — Progress Notes (Signed)
Virtual Visit via Telephone Note   This visit type was conducted due to national recommendations for restrictions regarding the COVID-19 Pandemic (e.g. social distancing) in an effort to limit this patient's exposure and mitigate transmission in our community.  Due to his co-morbid illnesses, this patient is at least at moderate risk for complications without adequate follow up.  This format is felt to be most appropriate for this patient at this time.  The patient did not have access to video technology/had technical difficulties with video requiring transitioning to audio format only (telephone).  All issues noted in this document were discussed and addressed.  No physical exam could be performed with this format.  Please refer to the patient's chart for his  consent to telehealth for [facility].   Date:  07/26/2019   ID:  Daniel Pleasureichard S Tenny, DOB August 13, 1951, MRN [id number]  Patient Location: Home Provider Location: Office  PCP:  Veverly FellsSanders, Kirk S, MD Cardiologist:  Nona DellSamuel Camry Robello, MD Electrophysiologist:  None   Evaluation Performed:  Follow-Up Visit  Chief Complaint:   Cardiac follow-up  History of Present Illness:    Daniel Rhodes is a 68 y.o. male last seen in June 2019.  We spoke by phone today.  He tells me that he has been doing well overall, no exertional chest pain or unusual shortness of breath, no nitroglycerin use.  He continues to work on weight loss and feels better.  Blood sugar also coming under better control with recent hemoglobin A1c down to 5.9%.  Recent lab work from July is outlined below.  He has a statin intolerance, does take omega-3 supplements.  I reviewed his medication list.  He needed refills for Toprol-XL and nitroglycerin.  The patient does not have symptoms concerning for COVID-19 infection (fever, chills, cough, or new shortness of breath).    Past Medical History:  Diagnosis Date  . Benign positional vertigo   . BPH (benign prostatic  hyperplasia)   . CAD (coronary artery disease)    60% LAD April 2017 - managed medically  . Diabetes mellitus with neuropathy (HCC)   . Essential hypertension   . GERD (gastroesophageal reflux disease)   . Hyperlipidemia   . Iron deficiency anemia   . Osteoarthritis   . Statin intolerance    Past Surgical History:  Procedure Laterality Date  . CARDIAC CATHETERIZATION N/A 03/28/2016   Procedure: Left Heart Cath and Coronary Angiography;  Surgeon: Marykay Lexavid W Harding, MD;  [facility];  Service: Cardiovascular;  Laterality: N/A;  . CARDIAC CATHETERIZATION N/A 03/28/2016   Procedure: Intravascular Pressure Wire/FFR Study;  Surgeon: Marykay Lexavid W Harding, MD;  [facility];  Service: Cardiovascular;  Laterality: N/A;  . CHOLECYSTECTOMY  2002  . ROTATOR CUFF REPAIR Right 2008  . TOE SURGERY  1963  . WRIST SURGERY Left      Current Meds  Medication Sig  . Ascorbic Acid (VITAMIN C) 1000 MG tablet Take 1,000 mg by mouth daily.  Marland Kitchen. aspirin 81 MG tablet Take 81 mg by mouth daily.  . B Complex-C (SUPER B COMPLEX PO) Take 1 tablet by mouth daily.  Marland Kitchen. CINNAMON PO Take 1,000 mg by mouth 2 (two) times daily.  . cyclobenzaprine (FLEXERIL) 10 MG tablet Take 10 mg by mouth at bedtime as needed for muscle spasms.   Marland Kitchen. esomeprazole (NEXIUM) 40 MG capsule Take 40 mg by mouth at bedtime.   . ferrous sulfate 325 (65 FE) MG tablet Take 325 mg by mouth  daily with breakfast.  . glipiZIDE (GLUCOTROL) 10 MG tablet Take 10 mg by mouth 2 (two) times daily before a meal.  . hydrochlorothiazide (HYDRODIURIL) 25 MG tablet Take 25 mg by mouth daily.  Marland Kitchen ibuprofen (ADVIL,MOTRIN) 800 MG tablet Take 800 mg by mouth every 8 (eight) hours as needed for moderate pain.   Marland Kitchen KRILL OIL PO Take 600 mg by mouth daily.  . Melatonin 5 MG TABS Take 1 tablet by mouth daily.  . metFORMIN (GLUCOPHAGE) 1000 MG tablet Take 1,000 mg by mouth 2 (two) times daily with a meal.  . metoprolol succinate (TOPROL-XL) 25 MG 24  hr tablet TAKE 1/2 TABLETS (12.5 MG TOTAL) BY MOUTH DAILY.  . Multiple Vitamins-Minerals (MULTIVITAMIN ADULTS 50+ PO) Take 1 tablet by mouth daily.  . nitroGLYCERIN (NITROSTAT) 0.4 MG SL tablet Place 1 tablet (0.4 mg total) under the tongue every 5 (five) minutes x 3 doses as needed for chest pain (if no relief after 3rd dose, proceed to the ED or call 911).  Marland Kitchen OZEMPIC, 1 MG/DOSE, 2 MG/1.5ML SOPN Inject 1 mg into the skin once a week.  . Tamsulosin HCl (FLOMAX) 0.4 MG CAPS Take 0.4 mg by mouth daily after supper.   . Vitamin D, Ergocalciferol, (DRISDOL) 50000 units CAPS capsule Take 50,000 Units by mouth every Wednesday.  . [DISCONTINUED] metoprolol succinate (TOPROL-XL) 25 MG 24 hr tablet TAKE 1/2 TABLETS (12.5 MG TOTAL) BY MOUTH DAILY.  . [DISCONTINUED] nitroGLYCERIN (NITROSTAT) 0.4 MG SL tablet Place 1 tablet (0.4 mg total) under the tongue every 5 (five) minutes as needed for chest pain.  . [DISCONTINUED] Omega-3 Fatty Acids (FISH OIL) 500 MG CAPS Take 500 mg by mouth daily.     Allergies:   Statins, Celecoxib, Codeine, Oxycodone-acetaminophen, Simvastatin, Sulfonamide derivatives, and Sulfamethoxazole   Social History   Tobacco Use  . Smoking status: Never Smoker  . Smokeless tobacco: Former Systems developer    Types: Chew  Substance Use Topics  . Alcohol use: Yes    Alcohol/week: 0.0 standard drinks    Comment: Occasional  . Drug use: Not on file     Family Hx: The patient's family history includes Heart attack in his father and mother.  ROS:   Please see the history of present illness. All other systems reviewed and are negative.   Prior CV studies:   The following studies were reviewed today:  Cardiac catheterization 03/28/2016: 1. Ost LAD to Prox LAD lesion, 30% stenosed. Mid LAD lesion, 60% stenosed. Dist LAD lesion, 40% stenosed. FFR 0.85 2. The left ventricular systolic function is normal.  The patient does have borderline LAD lesion with a focal 60% stenosis and booking 30  and 40% lesions on either side of D1. FFR evaluation was negative for potential ischemia. Therefore the decision was made not to proceed with PCI.  Labs/Other Tests and Data Reviewed:    EKG:  An ECG dated 06/16/2018 was personally reviewed today and demonstrated:  Sinus rhythm with low voltage in the precordial leads and poor R wave progression.  Recent Labs:  July 2020: Hemoglobin 15.7, platelets 275, BUN 14, creatinine 0.99, potassium 4.7, AST 24, ALT 30, hemoglobin A1c 5.9%, cholesterol 195, triglycerides 184, HDL 46, LDL 112  Wt Readings from Last 3 Encounters:  07/26/19 176 lb (79.8 kg)  06/16/18 199 lb (90.3 kg)  06/03/17 200 lb (90.7 kg)     Objective:    Vital Signs:  Ht 5\' 9"  (1.753 m)   Wt 176 lb (79.8 kg)  BMI 25.99 kg/m    He did not have a way to check vital signs today. Patient spoke in full sentences, not short of breath. No audible wheezing or coughing. Speech pattern normal.  ASSESSMENT & PLAN:    1.  Moderate mid LAD disease by cardiac catheterization in 2017.  He reports no active angina and we will continue with medical therapy which includes aspirin, beta-blocker, and as needed nitroglycerin.  2.  Mixed hyperlipidemia with statin intolerance.  Continue to follow with PCP, lab work reviewed.  He is no longer on Zetia, trying to work on diet and also continues with omega-3 supplements.  3.  Essential hypertension, on Toprol-XL and HCTZ.  Keep follow-up with PCP.  4.  Type 2 diabetes mellitus, hemoglobin A1c down to 5.9%.  He continues to work on weight loss.  COVID-19 Education: The signs and symptoms of COVID-19 were discussed with the patient and how to seek care for testing (follow up with PCP or arrange E-visit).  The importance of social distancing was discussed today.  Time:   Today, I have spent 6 minutes with the patient with telehealth technology discussing the above problems.     Medication Adjustments/Labs and Tests Ordered: Current  medicines are reviewed at length with the patient today.  Concerns regarding medicines are outlined above.   Tests Ordered: No orders of the defined types were placed in this encounter.   Medication Changes: Meds ordered this encounter  Medications  . metoprolol succinate (TOPROL-XL) 25 MG 24 hr tablet    Sig: TAKE 1/2 TABLETS (12.5 MG TOTAL) BY MOUTH DAILY.    Dispense:  45 tablet    Refill:  3  . nitroGLYCERIN (NITROSTAT) 0.4 MG SL tablet    Sig: Place 1 tablet (0.4 mg total) under the tongue every 5 (five) minutes x 3 doses as needed for chest pain (if no relief after 3rd dose, proceed to the ED or call 911).    Dispense:  25 tablet    Refill:  3    Follow Up:  In Person 1 year in the [place] office.  Signed, Nona DellSamuel Laronn Devonshire, MD  07/26/2019 10:00 AM    [facility]

## 2020-07-23 ENCOUNTER — Other Ambulatory Visit: Payer: Self-pay | Admitting: Cardiology

## 2020-09-21 ENCOUNTER — Telehealth: Payer: Self-pay | Admitting: Internal Medicine

## 2020-09-21 NOTE — Telephone Encounter (Signed)
Called patient to remind of upcoming appt with Dr Diona Browner 09/24/20 at 10:20 am.  Patient confirmed he will be there.

## 2020-09-23 NOTE — Progress Notes (Signed)
Cardiology Office Note  Date: 09/24/2020   ID: Daniel Rhodes, DOB 09/30/1951, MRN [id number]  PCP:  Veverly Fells, MD  Cardiologist:  Nona Dell, MD Electrophysiologist:  None   Chief Complaint  Patient presents with  . Cardiac follow-up    History of Present Illness: Daniel Rhodes is a 69 y.o. male last assessed via telehealth encounter in August 2020.  He presents for a routine visit.  He does not report any exertional angina, no nitroglycerin use.  We have continued medical management of previously documented moderate LAD disease as of 2017.  I reviewed his medications which are outlined below.  He has continued on aspirin, Toprol-XL, and as needed nitroglycerin.  He has a history of multistatin intolerance (Crestor, Lipitor, Zocor - myalgias and also history of elevated LFTs).  Recent lab work showed LDL 110.  We discussed referral to the lipid clinic.  He is following with endocrinology for management of hypercalcemia.  I reviewed the recent note from late September.  He has pending evaluation with surgeon for discussion of parathyroidectomy.  Past Medical History:  Diagnosis Date  . Benign positional vertigo   . BPH (benign prostatic hyperplasia)   . CAD (coronary artery disease)    60% LAD April 2017 - managed medically  . Diabetes mellitus with neuropathy (HCC)   . Essential hypertension   . GERD (gastroesophageal reflux disease)   . Hyperlipidemia   . Iron deficiency anemia   . Osteoarthritis   . Statin intolerance     Past Surgical History:  Procedure Laterality Date  . CARDIAC CATHETERIZATION N/A 03/28/2016   Procedure: Left Heart Cath and Coronary Angiography;  Surgeon: Marykay Lex, MD;  [facility]: [facility] CV LAB;  Service: Cardiovascular;  Laterality: N/A;  . CARDIAC CATHETERIZATION N/A 03/28/2016   Procedure: Intravascular Pressure Wire/FFR Study;  Surgeon: Marykay Lex, MD;  Location: [facility]VASIVE CV LAB;  Service: Cardiovascular;   Laterality: N/A;  . CHOLECYSTECTOMY  2002  . ROTATOR CUFF REPAIR Right 2008  . TOE SURGERY  1963  . WRIST SURGERY Left     Current Outpatient Medications  Medication Sig Dispense Refill  . Ascorbic Acid (VITAMIN C) 1000 MG tablet Take 1,000 mg by mouth daily.    Marland Kitchen aspirin 81 MG tablet Take 81 mg by mouth daily.    . B Complex-C (SUPER B COMPLEX PO) Take 1 tablet by mouth daily.    Marland Kitchen CINNAMON PO Take 1,000 mg by mouth 2 (two) times daily.    . cyclobenzaprine (FLEXERIL) 10 MG tablet Take 10 mg by mouth at bedtime as needed for muscle spasms.     Marland Kitchen esomeprazole (NEXIUM) 40 MG capsule Take 40 mg by mouth at bedtime.     . furosemide (LASIX) 20 MG tablet Take 20 mg by mouth daily as needed.    Marland Kitchen glipiZIDE (GLUCOTROL) 10 MG tablet Take 10 mg by mouth 2 (two) times daily before a meal.    . ibuprofen (ADVIL,MOTRIN) 800 MG tablet Take 800 mg by mouth every 8 (eight) hours as needed for moderate pain.     Marland Kitchen KRILL OIL PO Take 600 mg by mouth daily.    . Melatonin 5 MG TABS Take 1 tablet by mouth daily.    . metFORMIN (GLUCOPHAGE) 1000 MG tablet Take 1,000 mg by mouth 2 (two) times daily with a meal.    . metoprolol succinate (TOPROL-XL) 25 MG 24 hr tablet TAKE 1/2 TABLETS (12.5 MG TOTAL) BY MOUTH  DAILY. 45 tablet 0  . Multiple Vitamins-Minerals (MULTIVITAMIN ADULTS 50+ PO) Take 1 tablet by mouth daily.    . nitroGLYCERIN (NITROSTAT) 0.4 MG SL tablet Place 1 tablet (0.4 mg total) under the tongue every 5 (five) minutes x 3 doses as needed for chest pain (if no relief after 3rd dose, proceed to the ED or call 911). 25 tablet 3  . Semaglutide (OZEMPIC, 0.25 OR 0.5 MG/DOSE, [place]) Inject into the skin once a week.    . Tamsulosin HCl (FLOMAX) 0.4 MG CAPS Take 0.4 mg by mouth daily after supper.     . Vitamin D, Ergocalciferol, (DRISDOL) 50000 units CAPS capsule Take 50,000 Units by mouth every Wednesday.  1   No current facility-administered medications for this visit.   Allergies:  Statins, Celecoxib,  Codeine, Oxycodone-acetaminophen, Simvastatin, Sulfonamide derivatives, and Sulfamethoxazole   ROS: No palpitations or syncope.  Physical Exam: VS:  BP 124/60   Pulse 68   Ht 5\' 9"  (1.753 m)   Wt 198 lb 12.8 oz (90.2 kg)   SpO2 94%   BMI 29.36 kg/m , BMI Body mass index is 29.36 kg/m.  Wt Readings from Last 3 Encounters:  09/24/20 198 lb 12.8 oz (90.2 kg)  07/26/19 176 lb (79.8 kg)  06/16/18 199 lb (90.3 kg)    General: Patient appears comfortable at rest. HEENT: Conjunctiva and lids normal, wearing a mask. Neck: Supple, no elevated JVP or carotid bruits, no thyromegaly. Lungs: Clear to auscultation, nonlabored breathing at rest. Cardiac: Regular rate and rhythm, no S3 or significant systolic murmur, no pericardial rub. Abdomen: Soft, nontender, bowel sounds present. Extremities: No pitting edema, distal pulses 2+. Skin: Warm and dry. Musculoskeletal: No kyphosis. Neuropsychiatric: Alert and oriented x3, affect grossly appropriate.  ECG:  An ECG dated 06/16/2018 was personally reviewed today and demonstrated:  Sinus rhythm with low voltage in the precordial leads and poor R wave progression.  Recent Labwork:  September 2021: Hemoglobin 14.3, platelets 233, BUN 15, creatinine 0.99, potassium 4.2, AST 25, ALT 31, cholesterol 179, triglycerides 144, HDL 43, LDL 110, hemoglobin A1c 6.5%  Other Studies Reviewed Today:  Cardiac catheterization 03/28/2016: 1. Ost LAD to Prox LAD lesion, 30% stenosed. Mid LAD lesion, 60% stenosed. Dist LAD lesion, 40% stenosed. FFR 0.85 2. The left ventricular systolic function is normal.  The patient does have borderline LAD lesion with a focal 60% stenosis and booking 30 and 40% lesions on either side of D1. FFR evaluation was negative for potential ischemia. Therefore the decision was made not to proceed with PCI.  Assessment and Plan:  1.  History of moderate mid LAD stenosis, managed medically and without active angina symptoms or major  functional limitations at this point.  Continue aspirin, Toprol-XL, as needed nitroglycerin.  He has a history of statin intolerance.  2.  Mixed hyperlipidemia with statin intolerance as discussed above.  Plan referral to the lipid clinic to discuss possibility of PCSK9 inhibitors. Recent LDL was 110.  3.  Essential hypertension, blood pressure is well controlled today.  No changes made to present regimen.  Keep follow-up with Dr. 05/28/2016.  Medication Adjustments/Labs and Tests Ordered: Current medicines are reviewed at length with the patient today.  Concerns regarding medicines are outlined above.   Tests Ordered: Orders Placed This Encounter  Procedures  . AMB Referral to [facility]    Medication Changes: No orders of the defined types were placed in this encounter.   Disposition:  Follow up 1 year in the  [place] office.  Signed, Jonelle Sidle, MD, [facility] 09/24/2020 10:53 AM    [facility] at [facility] [address] [place], [place], [place] [address] Phone: [phone]; Fax: [phone]

## 2020-09-24 ENCOUNTER — Ambulatory Visit (INDEPENDENT_AMBULATORY_CARE_PROVIDER_SITE_OTHER): Payer: Medicare Other | Admitting: Cardiology

## 2020-09-24 ENCOUNTER — Encounter: Payer: Self-pay | Admitting: Cardiology

## 2020-09-24 VITALS — BP 124/60 | HR 68 | Ht 69.0 in | Wt 198.8 lb

## 2020-09-24 DIAGNOSIS — Z789 Other specified health status: Secondary | ICD-10-CM

## 2020-09-24 DIAGNOSIS — E782 Mixed hyperlipidemia: Secondary | ICD-10-CM

## 2020-09-24 DIAGNOSIS — I10 Essential (primary) hypertension: Secondary | ICD-10-CM

## 2020-09-24 DIAGNOSIS — I25119 Atherosclerotic heart disease of native coronary artery with unspecified angina pectoris: Secondary | ICD-10-CM | POA: Diagnosis not present

## 2020-09-24 NOTE — Patient Instructions (Signed)
Your physician wants you to follow-up in: 1 YEAR WITH DR MCDOWELL  You will receive a reminder letter in the mail two months in advance. If you don't receive a letter, please call our office to schedule the follow-up appointment.  Your physician recommends that you continue on your current medications as directed. Please refer to the Current Medication list given to you today.  You have been referred to LIPID CLINIC  Thank you for choosing [facility]!!

## 2020-09-24 NOTE — Addendum Note (Signed)
Addended by: Eustace Moore on: 09/24/2020 12:54 PM   Modules accepted: Orders

## 2020-10-15 ENCOUNTER — Other Ambulatory Visit: Payer: Self-pay | Admitting: Cardiology

## 2020-10-18 DIAGNOSIS — M8580 Other specified disorders of bone density and structure, unspecified site: Secondary | ICD-10-CM | POA: Insufficient documentation

## 2021-02-20 ENCOUNTER — Other Ambulatory Visit: Payer: Self-pay | Admitting: Cardiology

## 2021-06-13 ENCOUNTER — Inpatient Hospital Stay: Payer: Medicare Other | Attending: Genetic Counselor | Admitting: Genetic Counselor

## 2021-06-13 ENCOUNTER — Other Ambulatory Visit: Payer: Self-pay

## 2021-06-13 ENCOUNTER — Inpatient Hospital Stay: Payer: Medicare Other

## 2021-06-13 ENCOUNTER — Encounter: Payer: Self-pay | Admitting: Genetic Counselor

## 2021-06-13 DIAGNOSIS — Z8481 Family history of carrier of genetic disease: Secondary | ICD-10-CM

## 2021-06-13 DIAGNOSIS — Z803 Family history of malignant neoplasm of breast: Secondary | ICD-10-CM

## 2021-06-13 DIAGNOSIS — Z801 Family history of malignant neoplasm of trachea, bronchus and lung: Secondary | ICD-10-CM | POA: Diagnosis not present

## 2021-06-13 HISTORY — DX: Family history of malignant neoplasm of trachea, bronchus and lung: Z80.1

## 2021-06-13 HISTORY — DX: Family history of malignant neoplasm of breast: Z80.3

## 2021-06-13 LAB — GENETIC SCREENING ORDER

## 2021-06-13 NOTE — Progress Notes (Signed)
REFERRING PROVIDER: No referring provider defined for this encounter.  PRIMARY PROVIDER:  Jaynee Eagles, MD  PRIMARY REASON FOR VISIT:  1. Family history of MITF gene mutation    2. Family history of breast cancer   3. Family history of lung cancer     HISTORY OF PRESENT ILLNESS:   Daniel Rhodes, a 70 y.o. male, was seen for a Daniel Rhodes cancer genetics consultation due to a family history of cancer and a family history of a hereditary cancer gene mutation in his daughter.  Daniel Rhodes presents to clinic today to discuss the possibility of a hereditary predisposition to cancer, to discuss genetic testing, and to further clarify his future cancer risks, as well as potential cancer risks for family members.   Daniel Rhodes is a 69 y.o. male with no personal history of cancer.  He has a personal history of hypercalcemia, first detected in the last couple years, with no reported history parathyroid tumors or other endocrine-related tumors.    CANCER HISTORY:  Oncology History   No history exists.   RISK FACTORS:  Colonoscopy: yes;  f/u in 10 year intervals . Dermatology: not reported PSA: annually per patient Any excessive radiation exposure in the past: no  Past Medical History:  Diagnosis Date   Benign positional vertigo    BPH (benign prostatic hyperplasia)    CAD (coronary artery disease)    60% LAD April 2017 - managed medically   Diabetes mellitus with neuropathy (Daniel Rhodes)    Essential hypertension    Family history of breast cancer 06/13/2021   Family history of lung cancer 06/13/2021   GERD (gastroesophageal reflux disease)    Hyperlipidemia    Iron deficiency anemia    Osteoarthritis    Statin intolerance     Past Surgical History:  Procedure Laterality Date   CARDIAC CATHETERIZATION N/A 03/28/2016   Procedure: Left Heart Cath and Coronary Angiography;  Surgeon: Daniel Man, MD;  Location: [facility];  Service: Cardiovascular;  Laterality: N/A;   CARDIAC  CATHETERIZATION N/A 03/28/2016   Procedure: Intravascular Pressure Wire/FFR Study;  Surgeon: Daniel Man, MD;  Location: [facility];  Service: Cardiovascular;  Laterality: N/A;   CHOLECYSTECTOMY  2002   ROTATOR CUFF REPAIR Right 2008   TOE SURGERY  1963   WRIST SURGERY Left     Social History   Socioeconomic History   Marital status: Married    Spouse name: Not on file   Number of children: Not on file   Years of education: Not on file   Highest education level: Not on file  Occupational History   Occupation: Retired [employer]   Occupation: Security part time  Tobacco Use   Smoking status: Never   Smokeless tobacco: Former    Types: Chew    Quit date: 12/22/2005  Substance and Sexual Activity   Alcohol use: Yes    Alcohol/week: 0.0 standard drinks    Comment: Occasional   Drug use: Not on file   Sexual activity: Not on file  Other Topics Concern   Not on file  Social History Narrative   Not on file   Social Determinants of Health   Financial Resource Strain: Not on file  Food Insecurity: Not on file  Transportation Needs: Not on file  Physical Activity: Not on file  Stress: Not on file  Social Connections: Not on file     FAMILY HISTORY:  We obtained a detailed, 4-generation family history.  Significant  diagnoses are listed below: Family History  Problem Relation Age of Onset   Lung cancer Mother        d. 2   Lung cancer Father        d. 40   Breast cancer Maternal Aunt        d. 35   Breast cancer Maternal Aunt        d. 74   Breast cancer Cousin        maternal male cousin   Breast cancer Half-Sister        dx after 11   Other Daughter        mutation in MITF gene    Daniel Rhodes has one daughter, Daniel Rhodes, and one son.  His daughter recently had genetic testing for the [facility] CancerNext-Expanded +RNAinsight Panel.  The CancerNext-Expanded gene panel offered by [facility] and includes sequencing, rearrangement, and RNA analysis for the  following 77 genes: AIP, ALK, APC, ATM, AXIN2, BAP1, BARD1, BLM, BMPR1A, BRCA1, BRCA2, BRIP1, CDC73, CDH1, CDK4, CDKN1B, CDKN2A, CHEK2, CTNNA1, DICER1, FANCC, FH, FLCN, GALNT12, KIF1B, LZTR1, MAX, MEN1, MET, MLH1, MSH2, MSH3, MSH6, MUTYH, NBN, NF1, NF2, NTHL1, PALB2, PHOX2B, PMS2, POT1, PRKAR1A, PTCH1, PTEN, RAD51C, RAD51D, RB1, RECQL, RET, SDHA, SDHAF2, SDHB, SDHC, SDHD, SMAD4, SMARCA4, SMARCB1, SMARCE1, STK11, SUFU, TMEM127, TP53, TSC1, TSC2, VHL and XRCC2 (sequencing and deletion/duplication); EGFR, EGLN1, HOXB13, KIT, MITF, PDGFRA, POLD1, and POLE (sequencing only); EPCAM and GREM1 (deletion/duplication only).  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  Daniel Rhodes was adopted but recently connected with his biological family.   His daughter, Daniel Rhodes, collected some information about his biological family. He has one full sister, without a cancer sister.  He had three maternal half siblings, one of whom had breast cancer diagnosed after the age of 72. Daniel Rhodes mother had lung cancer and died at age 69.  He has two maternal aunts with breast cancer before the age of 19 and a maternal male cousin a history of breast cancer.  Daniel Rhodes biological father had lung cancer and died at age 28.   Daniel Rhodes is unaware of previous family history of genetic testing for hereditary cancer risks besides that mentioned agove. There is no reported Ashkenazi Jewish ancestry. There is no known consanguinity.  GENETIC COUNSELING ASSESSMENT: Daniel Rhodes is a 70 y.o. male with a family history of a hereditary cancer syndrome and a family  history of breast cancer which is somewhat suggestive of a predisposition to cancer. We therefore, discussed and recommended the following at today's visit.   DISCUSSION: We discussed that 5 - 10% of cancer is hereditary.  We reviewed that pathogenic variants in MITF are associated with an increased risk for melanoma and possibly renal cell carcinoma.  We  discussed that given that this variant in MITF was detected in his daughter, he has a 50% chance of also having the same pathogenic variant in MITF. At this point in time, the variant in MITF does not explain the family history of breast cancer.  Most cases of hereditary breast cancer associated with mutations in BRCA1/2.  There are other genes that can be associated with hereditary breast cancer syndromes.  We discussed that testing is beneficial for several reasons, including knowing about other cancer risks, identifying potential screening and risk-reduction options that may be appropriate, and to understanding if other family members could be at risk for cancer and allowing them to undergo genetic testing.  We reviewed the characteristics, features  and inheritance patterns of hereditary cancer syndromes. We also discussed genetic testing, including the appropriate family members to test, the process of testing, insurance coverage and turn-around-time for results. We discussed the implications of a negative, positive, carrier and/or variant of uncertain significant result. We discussed that negative results would be uninformative given that Daniel Rhodes does not have a personal history of cancer. We recommended Daniel Rhodes pursue genetic testing for a panel that contains genes associated with MITF as well as breast cancer and hyperparathyroidism.  The CancerNext-Expanded gene panel offered by [facility] and includes sequencing, rearrangement, and RNA analysis for the following 77 genes: AIP, ALK, APC, ATM, AXIN2, BAP1, BARD1, BLM, BMPR1A, BRCA1, BRCA2, BRIP1, CDC73, CDH1, CDK4, CDKN1B, CDKN2A, CHEK2, CTNNA1, DICER1, FANCC, FH, FLCN, GALNT12, KIF1B, LZTR1, MAX, MEN1, MET, MLH1, MSH2, MSH3, MSH6, MUTYH, NBN, NF1, NF2, NTHL1, PALB2, PHOX2B, PMS2, POT1, PRKAR1A, PTCH1, PTEN, RAD51C, RAD51D, RB1, RECQL, RET, SDHA, SDHAF2, SDHB, SDHC, SDHD, SMAD4, SMARCA4, SMARCB1, SMARCE1, STK11, SUFU, TMEM127, TP53, TSC1, TSC2,  VHL and XRCC2 (sequencing and deletion/duplication); EGFR, EGLN1, HOXB13, KIT, MITF, PDGFRA, POLD1, and POLE (sequencing only); EPCAM and GREM1 (deletion/duplication only).   Based on Daniel Rhodes family history of breast cancer and a family history of a known hereditary cancer mutation, he meets medical criteria for genetic testing. Despite that he meets criteria, he may still have an out of pocket cost. We discussed that if his out of pocket cost for testing is over $100, the laboratory should contact him to discuss self-pay options and/or patient pay assistance programs.    We discussed that some people do not want to undergo genetic testing due to fear of genetic discrimination.  A federal law called the Genetic Information Non-Discrimination Act (GINA) of 2008 helps protect individuals against genetic discrimination based on their genetic test results.  It impacts both health insurance and employment.  With health insurance, it protects against increased premiums, being kicked off insurance or being forced to take a test in order to be insured.  For employment it protects against hiring, firing and promoting decisions based on genetic test results.  GINA does not apply to those in the [employer], those who work for companies with less than 15 employees, and new life insurance or long-term disability insurance policies.  Health status due to a cancer diagnosis is not protected under GINA.  PLAN: After considering the risks, benefits, and limitations, Daniel Rhodes provided informed consent to pursue genetic testing and the blood sample was sent to [facility] for analysis of the CancerNext-Expanded +RNAinsight Panel. Results should be available within approximately 3 weeks' time, at which point they will be disclosed by telephone to Daniel Rhodes, as will any additional recommendations warranted by these results. Daniel Rhodes will receive a summary of his genetic counseling visit and a copy of his results once  available. This information will also be available in Epic.   Lastly, we encouraged Daniel Rhodes to remain in contact with cancer genetics annually so that we can continuously update the family history and inform him of any changes in cancer genetics and testing that may be of benefit for this family.   Daniel Rhodes questions were answered to his satisfaction today. Our contact information was provided should additional questions or concerns arise. Thank you for the referral and allowing [place] to share in the care of your patient.   Aviona Martenson M. Joette Catching, [place], [facility] Genetic Counselor [email or web address] (P) [phone]   The patient was seen for a total of 35 minutes  in face-to-face genetic counseling.  The patient was seen alone.  Drs. Magrinat, Lindi Adie and/or Burr Medico were available to discuss this case as needed.  _______________________________________________________________________ For Office Staff:  Number of people involved in session: 1 Was an Intern/ student involved with case: no

## 2021-07-08 ENCOUNTER — Ambulatory Visit: Payer: Self-pay | Admitting: Genetic Counselor

## 2021-07-08 ENCOUNTER — Telehealth: Payer: Self-pay | Admitting: Genetic Counselor

## 2021-07-08 ENCOUNTER — Encounter: Payer: Self-pay | Admitting: Genetic Counselor

## 2021-07-08 DIAGNOSIS — Z8481 Family history of carrier of genetic disease: Secondary | ICD-10-CM

## 2021-07-08 DIAGNOSIS — Z1379 Encounter for other screening for genetic and chromosomal anomalies: Secondary | ICD-10-CM

## 2021-07-08 DIAGNOSIS — Z803 Family history of malignant neoplasm of breast: Secondary | ICD-10-CM

## 2021-07-08 DIAGNOSIS — Z801 Family history of malignant neoplasm of trachea, bronchus and lung: Secondary | ICD-10-CM

## 2021-07-08 NOTE — Telephone Encounter (Addendum)
Revealed negative genetic testing.  Discussed negative for MITF family variant and chances of MITF-related cancers are about the same as the general population. Discussed that we do not know why there is cancer in the family. It could be sporadic/familial, due to a different gene that we are not testing, or maybe our current technology may not be able to pick something up.  It will be important for him to keep in contact with genetics to keep up with whether additional testing may be needed.

## 2021-07-08 NOTE — Progress Notes (Signed)
HPI:  Mr. Staffieri was previously seen in the [facility] [place] clinic due to a  family history of cancer and concerns regarding a hereditary predisposition to cancer. Please refer to our prior cancer genetics clinic note for more information regarding our discussion, assessment and recommendations, at the time. Mr. Kassel recent genetic test results were disclosed to him, as were recommendations warranted by these results. These results and recommendations are discussed in more detail below.  CANCER HISTORY:   Mr. Stadler is a 70 y.o. male with no personal history of cancer.  He has a personal history of hypercalcemia, first detected in the last couple years, with no reported history parathyroid tumors or other endocrine-related tumors.   FAMILY HISTORY:  We obtained a detailed, 4-generation family history.  Significant diagnoses are listed below: Family History  Problem Relation Age of Onset   Heart attack Mother        Age 51   Lung cancer Mother        d. 66   Heart attack Father        Age 43   Lung cancer Father        d. 77   Breast cancer Maternal Aunt        d. 37   Breast cancer Maternal Aunt        d. 11   Breast cancer Cousin        maternal male cousin   Breast cancer Half-Sister        dx after 77   Other Daughter        mutation in MITF gene     Mr. Blankenburg has one daughter, [name], and one son.  His daughter recently had genetic testing for the [facility] CancerNext-Expanded +RNAinsight Panel.  The CancerNext-Expanded gene panel offered by [facility] and includes sequencing, rearrangement, and RNA analysis for the following 77 genes: AIP, ALK, APC, ATM, AXIN2, BAP1, BARD1, BLM, BMPR1A, BRCA1, BRCA2, BRIP1, CDC73, CDH1, CDK4, CDKN1B, CDKN2A, CHEK2, CTNNA1, DICER1, FANCC, FH, FLCN, GALNT12, KIF1B, LZTR1, MAX, MEN1, MET, MLH1, MSH2, MSH3, MSH6, MUTYH, NBN, NF1, NF2, NTHL1, PALB2, PHOX2B, PMS2, POT1, PRKAR1A, PTCH1, PTEN, RAD51C, RAD51D, RB1, RECQL, RET, SDHA, SDHAF2,  SDHB, SDHC, SDHD, SMAD4, SMARCA4, SMARCB1, SMARCE1, STK11, SUFU, TMEM127, TP53, TSC1, TSC2, VHL and XRCC2 (sequencing and deletion/duplication); EGFR, EGLN1, HOXB13, KIT, MITF, PDGFRA, POLD1, and POLE (sequencing only); EPCAM and GREM1 (deletion/duplication only).  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   Mr. Chelf was adopted but recently connected with his biological family.   His daughter, [name], collected some information about his biological family. He has one full sister, without a cancer sister.  He had three maternal half siblings, one of whom had breast cancer diagnosed after the age of 37. Mr. Schara mother had lung cancer and died at age 63.  He has two maternal aunts with breast cancer before the age of 27 and a maternal male cousin a history of breast cancer.  Mr. Cajuste biological father had lung cancer and died at age 47.   Mr. Schetter is unaware of previous family history of genetic testing for hereditary cancer risks besides that mentioned agove. There is no reported Ashkenazi Jewish ancestry. There is no known consanguinity.    GENETIC TEST RESULTS: Genetic testing reported out on July 04, 2021.  The CancerNext-Expanded+RNAinsight Panel through [facility] found no pathogenic mutations. The CancerNext-Expanded gene panel offered by [facility] and includes sequencing, rearrangement, and RNA analysis for the  following 77 genes: AIP, ALK, APC, ATM, AXIN2, BAP1, BARD1, BLM, BMPR1A, BRCA1, BRCA2, BRIP1, CDC73, CDH1, CDK4, CDKN1B, CDKN2A, CHEK2, CTNNA1, DICER1, FANCC, FH, FLCN, GALNT12, KIF1B, LZTR1, MAX, MEN1, MET, MLH1, MSH2, MSH3, MSH6, MUTYH, NBN, NF1, NF2, NTHL1, PALB2, PHOX2B, PMS2, POT1, PRKAR1A, PTCH1, PTEN, RAD51C, RAD51D, RB1, RECQL, RET, SDHA, SDHAF2, SDHB, SDHC, SDHD, SMAD4, SMARCA4, SMARCB1, SMARCE1, STK11, SUFU, TMEM127, TP53, TSC1, TSC2, VHL and XRCC2 (sequencing and deletion/duplication); EGFR, EGLN1, HOXB13, KIT, MITF, PDGFRA, POLD1, and  POLE (sequencing only); EPCAM and GREM1 (deletion/duplication only).   The test report has been scanned into EPIC and is located under the Molecular Pathology section of the Results Review tab.  A portion of the result report is included below for reference.     We discussed with Mr. Fredericksen that because current genetic testing is not perfect, it is possible there may be a gene mutation in one of these genes that current testing cannot detect, but that chance is small.  We also discussed, that there could be another gene that has not yet been discovered, or that we have not yet tested, that is responsible for the cancer diagnoses in the family. It is also possible there is a hereditary cause for the cancer in the family that Mr. Busic did not inherit and therefore was not identified in his testing.  Therefore, it is important to remain in touch with cancer genetics in the future so that we can continue to offer Mr. Keim the most up to date genetic testing.   We recommended Mr. Conchas pursue testing for the familial hereditary cancer gene mutation called MITF p.E318K. Mr. Justen test was normal and did not reveal the familial mutation. We call this result a true negative result because a cancer-causing mutation was identified in Mr. Canada family, and he did not inherit it.  Given this negative result, Mr. Ottaviano chances of developing MITF-related cancers are the same as they are in the general population.    ADDITIONAL GENETIC TESTING: We discussed with Mr. Reader that his genetic testing was fairly extensive.  If there are genes identified to increase cancer risk that can be analyzed in the future, we would be happy to discuss and coordinate this testing at that time.    CANCER SCREENING RECOMMENDATIONS: Mr. Fukushima test result is considered negative (normal).  This means that we have not identified a hereditary cause for his family history of cancer at this time.   While reassuring, this does not  definitively rule out a hereditary predisposition to cancer. It is still possible that there could be genetic mutations that are undetectable by current technology. There could be genetic mutations in genes that have not been tested or identified to increase cancer risk.  Therefore, it is recommended he continue to follow the cancer management and screening guidelines provided by his primary healthcare provider.   An individual's cancer risk and medical management are not determined by genetic test results alone. Overall cancer risk assessment incorporates additional factors, including personal medical history, family history, and any available genetic information that may result in a personalized plan for cancer prevention and surveillance  RECOMMENDATIONS FOR FAMILY MEMBERS:  Individuals in this family might be at some increased risk of developing cancer, over the general population risk, simply due to the family history of cancer.  We recommended women in this family have a yearly mammogram beginning at age 27, or 34 years younger than the earliest onset of cancer, an annual clinical breast exam, and perform  monthly breast self-exams. Women in this family should also have a gynecological exam as recommended by their primary provider. All family members should be referred for colonoscopy starting at age 57, or earlier, as recommended by their providers.    It is also possible there is a hereditary cause for the cancer in Mr. Wain family that he did not inherit and therefore was not identified in him.  Based on Mr. Bhardwaj family history of breast cancer, we recommended his biological sister and other maternal family members have genetic counseling and testing. Mr. Ksiazek will let us know if we can be of any assistance in coordinating genetic counseling and/or testing for this family member.   FOLLOW-UP: Lastly, we discussed with Mr. Ressel that cancer genetics is a rapidly advancing field and it is  possible that new genetic tests will be appropriate for him and/or his family members in the future. We encouraged him to remain in contact with cancer genetics on an annual basis so we can update his personal and family histories and let him know of advances in cancer genetics that may benefit this family.   Our contact number was provided. Mr. Gopal questions were answered to his satisfaction, and he knows he is welcome to call us at anytime with additional questions or concerns.     Rashaud Ybarbo M. Joette Catching, [place], [facility] Genetic Counselor [email or web address] (P) [phone]

## 2021-12-24 DIAGNOSIS — H52221 Regular astigmatism, right eye: Secondary | ICD-10-CM | POA: Insufficient documentation

## 2022-01-07 ENCOUNTER — Other Ambulatory Visit: Payer: Self-pay | Admitting: *Deleted

## 2022-01-07 ENCOUNTER — Ambulatory Visit: Payer: Medicare Other | Admitting: Cardiology

## 2022-01-07 ENCOUNTER — Telehealth: Payer: Self-pay | Admitting: Cardiology

## 2022-01-07 ENCOUNTER — Encounter: Payer: Self-pay | Admitting: Cardiology

## 2022-01-07 ENCOUNTER — Other Ambulatory Visit: Payer: Self-pay | Admitting: Cardiology

## 2022-01-07 VITALS — BP 120/70 | HR 69 | Ht 69.0 in | Wt 192.8 lb

## 2022-01-07 DIAGNOSIS — Z01810 Encounter for preprocedural cardiovascular examination: Secondary | ICD-10-CM

## 2022-01-07 DIAGNOSIS — I200 Unstable angina: Secondary | ICD-10-CM

## 2022-01-07 DIAGNOSIS — E782 Mixed hyperlipidemia: Secondary | ICD-10-CM | POA: Diagnosis not present

## 2022-01-07 DIAGNOSIS — I25119 Atherosclerotic heart disease of native coronary artery with unspecified angina pectoris: Secondary | ICD-10-CM | POA: Diagnosis not present

## 2022-01-07 DIAGNOSIS — Z01818 Encounter for other preprocedural examination: Secondary | ICD-10-CM | POA: Diagnosis not present

## 2022-01-07 DIAGNOSIS — Z789 Other specified health status: Secondary | ICD-10-CM

## 2022-01-07 MED ORDER — SODIUM CHLORIDE 0.9% FLUSH: 3.0000 mL | Freq: Two times a day (BID) | INTRAVENOUS | Status: DC

## 2022-01-07 MED ORDER — NITROGLYCERIN 0.4 MG SL SUBL: 0.4000 mg | SUBLINGUAL_TABLET | SUBLINGUAL | 3 refills | Status: DC | PRN

## 2022-01-07 NOTE — Progress Notes (Signed)
Cardiology Office Note  Date: 01/07/2022   ID: Daniel Rhodes, DOB [birth year], MRN [id number]  PCP:  Veverly Fells, MD  Cardiologist:  Nona Dell, MD Electrophysiologist:  None   Chief Complaint  Patient presents with   Cardiac follow-up    History of Present Illness: Daniel Rhodes is a 71 y.o. male last seen in the office in October 2021.  He called reporting recent chest discomfort and was added on to the schedule today.  He tells me that he has been doing well until this past Thursday when he woke up at 3 AM with chest tightness, this lasted about 15 minutes and resolved as he was sitting on the side of the bed.  He did not have any nitroglycerin that was fresh.  The same thing happened on Friday.  He had another recurrence yesterday when he was outside feeding his goat.  He has a known history of moderate nonobstructive CAD with 60% LAD stenosis as of 2017.  I personally reviewed his ECG today which shows sinus rhythm with prolonged PR interval and increased voltage.  He has a history of multistatin intolerance (Crestor, Lipitor, Zocor - myalgias and also history of elevated LFTs).  He did not pursue lipid clinic referral previously, we did discuss reevaluation for PCSK9 inhibitor.  Recent lab work in October 2022 showed LDL 114.  Past Medical History:  Diagnosis Date   Benign positional vertigo    BPH (benign prostatic hyperplasia)    CAD (coronary artery disease)    60% LAD April 2017 - managed medically   Diabetes mellitus with neuropathy [facility])    Essential hypertension    Family history of breast cancer 06/13/2021   Family history of lung cancer 06/13/2021   GERD (gastroesophageal reflux disease)    Hyperlipidemia    Iron deficiency anemia    Osteoarthritis    Statin intolerance     Past Surgical History:  Procedure Laterality Date   CARDIAC CATHETERIZATION N/A 03/28/2016   Procedure: Left Heart Cath and Coronary Angiography;  Surgeon: Marykay Lex, MD;   Location: [facility] INVASIVE CV LAB;  Service: Cardiovascular;  Laterality: N/A;   CARDIAC CATHETERIZATION N/A 03/28/2016   Procedure: Intravascular Pressure Wire/FFR Study;  Surgeon: Marykay Lex, MD;  Location: [facility] INVASIVE CV LAB;  Service: Cardiovascular;  Laterality: N/A;   CHOLECYSTECTOMY  2002   ROTATOR CUFF REPAIR Right 2008   TOE SURGERY  1963   WRIST SURGERY Left     Current Outpatient Medications  Medication Sig Dispense Refill   Ascorbic Acid (VITAMIN C) 1000 MG tablet Take 1,000 mg by mouth daily.     aspirin 81 MG tablet Take 81 mg by mouth daily.     B Complex-C (SUPER B COMPLEX PO) Take 1 tablet by mouth daily.     CINNAMON PO Take 1,000 mg by mouth 2 (two) times daily.     cyclobenzaprine (FLEXERIL) 10 MG tablet Take 10 mg by mouth at bedtime as needed for muscle spasms.      esomeprazole (NEXIUM) 40 MG capsule Take 40 mg by mouth at bedtime.      furosemide (LASIX) 20 MG tablet Take 20 mg by mouth daily.     glipiZIDE (GLUCOTROL) 10 MG tablet Take 20mg  (2 tabs) by mouth every morning & 10mg  (1 tab) at suppertime     ibuprofen (ADVIL,MOTRIN) 800 MG tablet Take 800 mg by mouth every 8 (eight) hours as needed for moderate pain.  KRILL OIL PO Take 600 mg by mouth daily.     Melatonin 10 MG TABS Take 1 tablet by mouth at bedtime.     metFORMIN (GLUCOPHAGE-XR) 500 MG 24 hr tablet 1,000 mg daily with supper.     metoprolol succinate (TOPROL-XL) 25 MG 24 hr tablet TAKE 1/2 TABLETS (12.5 MG TOTAL) BY MOUTH DAILY. 45 tablet 3   Multiple Vitamins-Minerals (MULTIVITAMIN ADULTS 50+ PO) Take 1 tablet by mouth daily.     nitroGLYCERIN (NITROSTAT) 0.4 MG SL tablet Place 1 tablet (0.4 mg total) under the tongue every 5 (five) minutes x 3 doses as needed for chest pain (if no relief after 2nd dose, proceed to the ED or call 911). 25 tablet 3   Semaglutide (OZEMPIC, 0.25 OR 0.5 MG/DOSE, [place]) Inject 1 mg into the skin once a week.     Tamsulosin HCl (FLOMAX) 0.4 MG CAPS Take 0.4 mg by mouth  daily after supper.      No current facility-administered medications for this visit.   Allergies:  Statins, Celecoxib, Codeine, Oxycodone-acetaminophen, Simvastatin, Sulfonamide derivatives, and Sulfamethoxazole   Social History: The patient  reports that he has never smoked. He quit smokeless tobacco use about 16 years ago.  His smokeless tobacco use included chew. He reports current alcohol use.   Family History: The patient's family history includes Breast cancer in his cousin, half-sister, maternal aunt, and maternal aunt; Heart attack in his father and mother; Lung cancer in his father and mother; Other in his daughter.   ROS: No palpitations or syncope.  Physical Exam: VS:  BP 120/70    Pulse 69    Ht 5\' 9"  (1.753 m)    Wt 192 lb 12.8 oz (87.5 kg)    SpO2 100%    BMI 28.47 kg/m , BMI Body mass index is 28.47 kg/m.  Wt Readings from Last 3 Encounters:  01/07/22 192 lb 12.8 oz (87.5 kg)  09/24/20 198 lb 12.8 oz (90.2 kg)  07/26/19 176 lb (79.8 kg)    General: Patient appears comfortable at rest. HEENT: Conjunctiva and lids normal, oropharynx clear with moist mucosa. Neck: Supple, no elevated JVP or carotid bruits, no thyromegaly. Lungs: Clear to auscultation, nonlabored breathing at rest. Cardiac: Regular rate and rhythm, no S3 or significant systolic murmur, no pericardial rub. Abdomen: Soft, nontender, bowel sounds present. Extremities: No pitting edema, distal pulses 2+. Skin: Warm and dry. Musculoskeletal: No kyphosis. Neuropsychiatric: Alert and oriented x3, affect grossly appropriate.  ECG:  An ECG dated 09/24/2020 was personally reviewed today and demonstrated:  Sinus rhythm with prolonged PR interval and increased voltage.  Recent Labwork:  September 2021: Hemoglobin 14.3, platelets 233, BUN 15, creatinine 0.99, potassium 4.2, AST 25, ALT 31, cholesterol 179, triglycerides 144, HDL 43, LDL 110, hemoglobin A1c 6.5% October 2022: Hemoglobin 14.2, platelets 231, BUN 15,  creatinine 1.14, potassium 4.4, AST 18, ALT 20, cholesterol 186, triglycerides 120, HDL 51, LDL 114  Other Studies Reviewed Today:  Cardiac catheterization 03/28/2016: Ost LAD to Prox LAD lesion, 30% stenosed. Mid LAD lesion, 60% stenosed. Dist LAD lesion, 40% stenosed. FFR 0.85 The left ventricular systolic function is normal.   The patient does have borderline LAD lesion with a focal 60% stenosis and booking 30 and 40% lesions on either side of D1. FFR evaluation was negative for potential ischemia. Therefore the decision was made not to proceed with PCI.  Assessment and Plan:  1.  Accelerating angina as discussed above.  Patient has a known history of moderate  nonobstructive CAD as of 2017 at which point he had a 60% LAD stenosis with negative FFR assessment.  ECG reviewed.  We discussed repeat diagnostic cardiac catheterization to evaluate for progressive disease and revascularization options, risks and benefits reviewed and he is in agreement to proceed.  Continue aspirin, Toprol-XL, Ozempic, and refill nitroglycerin.  We will concurrently refer him for lipid clinic review of PCSK9 inhibitor coverage. ° °2.  Mixed hyperlipidemia with multi-statin intolerance as discussed above.  Refer for lipid clinic review of PCSK9 inhibitor coverage. ° °3.  Type 2 diabetes mellitus, currently on Ozempic, Glucophage XR, and Glucotrol. ° °Medication Adjustments/Labs and Tests Ordered: °Current medicines are reviewed at length with the patient today.  Concerns regarding medicines are outlined above.  ° °Tests Ordered: °Orders Placed This Encounter  °Procedures  ° Basic metabolic panel  ° CBC  ° ° °Medication Changes: °No orders of the defined types were placed in this encounter. ° ° °Disposition:  Follow up  after procedure. ° °Signed, °Rasheka Denard G. Gurpreet Mikhail, MD, FACC °01/07/2022 2:13 PM    °[facility] [place] °[address], [place], [place] [address] °Phone: [phone]; Fax: [phone]  °

## 2022-01-07 NOTE — H&P (View-Only) (Signed)
Cardiology Office Note  Date: 01/07/2022   ID: Daniel Rhodes, DOB [birth year], MRN [id number]  PCP:  Veverly Fells, MD  Cardiologist:  Nona Dell, MD Electrophysiologist:  None   Chief Complaint  Patient presents with   Cardiac follow-up    History of Present Illness: Daniel Rhodes is a 71 y.o. male last seen in the office in October 2021.  He called reporting recent chest discomfort and was added on to the schedule today.  He tells me that he has been doing well until this past Thursday when he woke up at 3 AM with chest tightness, this lasted about 15 minutes and resolved as he was sitting on the side of the bed.  He did not have any nitroglycerin that was fresh.  The same thing happened on Friday.  He had another recurrence yesterday when he was outside feeding his goat.  He has a known history of moderate nonobstructive CAD with 60% LAD stenosis as of 2017.  I personally reviewed his ECG today which shows sinus rhythm with prolonged PR interval and increased voltage.  He has a history of multistatin intolerance (Crestor, Lipitor, Zocor - myalgias and also history of elevated LFTs).  He did not pursue lipid clinic referral previously, we did discuss reevaluation for PCSK9 inhibitor.  Recent lab work in October 2022 showed LDL 114.  Past Medical History:  Diagnosis Date   Benign positional vertigo    BPH (benign prostatic hyperplasia)    CAD (coronary artery disease)    60% LAD April 2017 - managed medically   Diabetes mellitus with neuropathy [facility])    Essential hypertension    Family history of breast cancer 06/13/2021   Family history of lung cancer 06/13/2021   GERD (gastroesophageal reflux disease)    Hyperlipidemia    Iron deficiency anemia    Osteoarthritis    Statin intolerance     Past Surgical History:  Procedure Laterality Date   CARDIAC CATHETERIZATION N/A 03/28/2016   Procedure: Left Heart Cath and Coronary Angiography;  Surgeon: Marykay Lex, MD;   Location: [facility] INVASIVE CV LAB;  Service: Cardiovascular;  Laterality: N/A;   CARDIAC CATHETERIZATION N/A 03/28/2016   Procedure: Intravascular Pressure Wire/FFR Study;  Surgeon: Marykay Lex, MD;  Location: [facility] INVASIVE CV LAB;  Service: Cardiovascular;  Laterality: N/A;   CHOLECYSTECTOMY  2002   ROTATOR CUFF REPAIR Right 2008   TOE SURGERY  1963   WRIST SURGERY Left     Current Outpatient Medications  Medication Sig Dispense Refill   Ascorbic Acid (VITAMIN C) 1000 MG tablet Take 1,000 mg by mouth daily.     aspirin 81 MG tablet Take 81 mg by mouth daily.     B Complex-C (SUPER B COMPLEX PO) Take 1 tablet by mouth daily.     CINNAMON PO Take 1,000 mg by mouth 2 (two) times daily.     cyclobenzaprine (FLEXERIL) 10 MG tablet Take 10 mg by mouth at bedtime as needed for muscle spasms.      esomeprazole (NEXIUM) 40 MG capsule Take 40 mg by mouth at bedtime.      furosemide (LASIX) 20 MG tablet Take 20 mg by mouth daily.     glipiZIDE (GLUCOTROL) 10 MG tablet Take 20mg  (2 tabs) by mouth every morning & 10mg  (1 tab) at suppertime     ibuprofen (ADVIL,MOTRIN) 800 MG tablet Take 800 mg by mouth every 8 (eight) hours as needed for moderate pain.  KRILL OIL PO Take 600 mg by mouth daily.     Melatonin 10 MG TABS Take 1 tablet by mouth at bedtime.     metFORMIN (GLUCOPHAGE-XR) 500 MG 24 hr tablet 1,000 mg daily with supper.     metoprolol succinate (TOPROL-XL) 25 MG 24 hr tablet TAKE 1/2 TABLETS (12.5 MG TOTAL) BY MOUTH DAILY. 45 tablet 3   Multiple Vitamins-Minerals (MULTIVITAMIN ADULTS 50+ PO) Take 1 tablet by mouth daily.     nitroGLYCERIN (NITROSTAT) 0.4 MG SL tablet Place 1 tablet (0.4 mg total) under the tongue every 5 (five) minutes x 3 doses as needed for chest pain (if no relief after 2nd dose, proceed to the ED or call 911). 25 tablet 3   Semaglutide (OZEMPIC, 0.25 OR 0.5 MG/DOSE, [place]) Inject 1 mg into the skin once a week.     Tamsulosin HCl (FLOMAX) 0.4 MG CAPS Take 0.4 mg by mouth  daily after supper.      No current facility-administered medications for this visit.   Allergies:  Statins, Celecoxib, Codeine, Oxycodone-acetaminophen, Simvastatin, Sulfonamide derivatives, and Sulfamethoxazole   Social History: The patient  reports that he has never smoked. He quit smokeless tobacco use about 16 years ago.  His smokeless tobacco use included chew. He reports current alcohol use.   Family History: The patient's family history includes Breast cancer in his cousin, half-sister, maternal aunt, and maternal aunt; Heart attack in his father and mother; Lung cancer in his father and mother; Other in his daughter.   ROS: No palpitations or syncope.  Physical Exam: VS:  BP 120/70    Pulse 69    Ht 5\' 9"  (1.753 m)    Wt 192 lb 12.8 oz (87.5 kg)    SpO2 100%    BMI 28.47 kg/m , BMI Body mass index is 28.47 kg/m.  Wt Readings from Last 3 Encounters:  01/07/22 192 lb 12.8 oz (87.5 kg)  09/24/20 198 lb 12.8 oz (90.2 kg)  07/26/19 176 lb (79.8 kg)    General: Patient appears comfortable at rest. HEENT: Conjunctiva and lids normal, oropharynx clear with moist mucosa. Neck: Supple, no elevated JVP or carotid bruits, no thyromegaly. Lungs: Clear to auscultation, nonlabored breathing at rest. Cardiac: Regular rate and rhythm, no S3 or significant systolic murmur, no pericardial rub. Abdomen: Soft, nontender, bowel sounds present. Extremities: No pitting edema, distal pulses 2+. Skin: Warm and dry. Musculoskeletal: No kyphosis. Neuropsychiatric: Alert and oriented x3, affect grossly appropriate.  ECG:  An ECG dated 09/24/2020 was personally reviewed today and demonstrated:  Sinus rhythm with prolonged PR interval and increased voltage.  Recent Labwork:  September 2021: Hemoglobin 14.3, platelets 233, BUN 15, creatinine 0.99, potassium 4.2, AST 25, ALT 31, cholesterol 179, triglycerides 144, HDL 43, LDL 110, hemoglobin A1c 6.5% October 2022: Hemoglobin 14.2, platelets 231, BUN 15,  creatinine 1.14, potassium 4.4, AST 18, ALT 20, cholesterol 186, triglycerides 120, HDL 51, LDL 114  Other Studies Reviewed Today:  Cardiac catheterization 03/28/2016: Ost LAD to Prox LAD lesion, 30% stenosed. Mid LAD lesion, 60% stenosed. Dist LAD lesion, 40% stenosed. FFR 0.85 The left ventricular systolic function is normal.   The patient does have borderline LAD lesion with a focal 60% stenosis and booking 30 and 40% lesions on either side of D1. FFR evaluation was negative for potential ischemia. Therefore the decision was made not to proceed with PCI.  Assessment and Plan:  1.  Accelerating angina as discussed above.  Patient has a known history of moderate  nonobstructive CAD as of 2017 at which point he had a 60% LAD stenosis with negative FFR assessment.  ECG reviewed.  We discussed repeat diagnostic cardiac catheterization to evaluate for progressive disease and revascularization options, risks and benefits reviewed and he is in agreement to proceed.  Continue aspirin, Toprol-XL, Ozempic, and refill nitroglycerin.  We will concurrently refer him for lipid clinic review of PCSK9 inhibitor coverage.  2.  Mixed hyperlipidemia with multi-statin intolerance as discussed above.  Refer for lipid clinic review of PCSK9 inhibitor coverage.  3.  Type 2 diabetes mellitus, currently on Ozempic, Glucophage XR, and Glucotrol.  Medication Adjustments/Labs and Tests Ordered: Current medicines are reviewed at length with the patient today.  Concerns regarding medicines are outlined above.   Tests Ordered: Orders Placed This Encounter  Procedures   Basic metabolic panel   CBC    Medication Changes: No orders of the defined types were placed in this encounter.   Disposition:  Follow up  after procedure.  Signed, Jonelle SidleSamuel G. Elis Rawlinson, MD, [facility] CenterFACC 01/07/2022 2:13 PM    [facility] [address], [place], [place] [address] Phone: [phone]; Fax: [phone]

## 2022-01-07 NOTE — Telephone Encounter (Signed)
Pt c/o of Chest Pain: STAT if CP now or developed within 24 hours  1. Are you having CP right now? no  2. Are you experiencing any other symptoms (ex. SOB, nausea, vomiting, sweating)? Said pain was running down his left arm and headache.   3. How long have you been experiencing CP? Started last week Thursday 01/02/22  4. Is your CP continuous or coming and going? Comes and goes   5. Have you taken Nitroglycerin? No  ?

## 2022-01-07 NOTE — Telephone Encounter (Signed)
Reports chest pain rated 8/10 that started Thursday 01/02/2022 @3 :00 am and again on 01/03/2022 same time. Says he also had pain going down left arm and headache. Denies SOB or dizzines. Did not use nitroglycerin due to needing refill. Denies active chest pain. Gave first available appointment today at 1:40 pm Sent nitro refill

## 2022-01-07 NOTE — Patient Instructions (Addendum)
Medication Instructions:  Your physician recommends that you continue on your current medications as directed. Please refer to the Current Medication list given to you today.  Labwork: BMET & CBC today at [facility]  Testing/Procedures: Your physician has requested that you have a cardiac catheterization. Cardiac catheterization is used to diagnose and/or treat various heart conditions. Doctors may recommend this procedure for a number of different reasons. The most common reason is to evaluate chest pain. Chest pain can be a symptom of coronary artery disease (CAD), and cardiac catheterization can show whether plaque is narrowing or blocking your hearts arteries. This procedure is also used to evaluate the valves, as well as measure the blood flow and oxygen levels in different parts of your heart. For further information please visit [email or web address] Please follow instruction sheet, as given.  Follow-Up: Your physician recommends that you schedule a follow-up appointment in: 1 month  Any Other Special Instructions Will Be Listed Below (If Applicable). Please schedule a lipid clinic appointment  If you need a refill on your cardiac medications before your next appointment, please call your pharmacy.   [facility] CARDIOVASCULAR DIVISION [facility] [place] [address] [place] [address] Dept: [phone] Loc: [phone]  AHAN EISENBERGER  01/07/2022  You are scheduled for a Cardiac Catheterization on Thursday, January 26 with Dr. Cristal [place].  1. Please arrive at the [facility] (Main Entrance A) at [facility]: [address] [place], [place] [address] at 8:30 AM (This time is two hours before your procedure to ensure your preparation). Free valet parking service is available.   Special note: Every effort is made to have your procedure done on time. Please understand that emergencies sometimes delay  scheduled procedures.  2. Diet: Do not eat solid foods after midnight.  The patient may have clear liquids until 5am upon the day of the procedure.  3. Labs: You will need to have blood drawn on Tuesday, January 17 at [facility]. You do not need to be fasting.  4. Medication instructions in preparation for your procedure: Hold morning dose of furosemide, glipizide and metformin. Hold metformin 48 hours after cath also.    Contrast Allergy: No  Stop taking, Lasix (Furosemide)  Thursday, January 26,  Do not take Diabetes Med Glucophage (Metformin) on the day of the procedure and HOLD 48 HOURS AFTER THE PROCEDURE.  On the morning of your procedure, take your Aspirin 81 mg and any morning medicines NOT listed above.  You may use sips of water.  5. Plan for one night stay--bring personal belongings. 6. Bring a current list of your medications and current insurance cards. 7. You MUST have a responsible person to drive you home. 8. Someone MUST be with you the first 24 hours after you arrive home or your discharge will be delayed. 9. Please wear clothes that are easy to get on and off and wear slip-on shoes.  Thank you for allowing [place] to care for you!   -- [facility]

## 2022-01-07 NOTE — Telephone Encounter (Signed)
PERCERT:   Left Heart Cath dx: accelerating angina 01/16/2022 @10 :30 am with Dr. 

## 2022-01-15 ENCOUNTER — Telehealth: Payer: Self-pay | Admitting: *Deleted

## 2022-01-15 NOTE — Telephone Encounter (Signed)
Cardiac catheterization scheduled at [facility] for: Thursday January 16, 2022 10:30 AM [facility] Main Entrance A [facility]) at: 8:30 AM   Diet-no solid food after midnight prior to cath, clear liquids until 5 AM day of procedure.  Medication instructions for procedure: -Hold:  Metformin-day of procedure and 48 hours post procedure  Glipizide-AM of procedure  Lasix-AM of procedure -Except hold medications usual morning medications can be taken pre-cath with sips of water including aspirin 81 mg.    Confirmed patient has responsible adult to drive home post procedure and be with patient first 24 hours after arriving home.  [facility] does allow one visitor to accompany you and wait in the hospital waiting room while you are there for your procedure. You and your visitor will be asked to wear a mask once you enter the hospital.   Patient reports does not currently have any new symptoms concerning for COVID-19 and no household members with COVID-19 like illness.        Reviewed procedure/mask/visitor instructions with patient.

## 2022-01-16 ENCOUNTER — Other Ambulatory Visit: Payer: Self-pay

## 2022-01-16 ENCOUNTER — Other Ambulatory Visit (HOSPITAL_COMMUNITY): Payer: Self-pay

## 2022-01-16 ENCOUNTER — Encounter (HOSPITAL_COMMUNITY)
Admission: RE | Disposition: A | Discharge status: Home / Self Care | Payer: Self-pay | Source: Home / Self Care | Attending: Internal Medicine

## 2022-01-16 ENCOUNTER — Ambulatory Visit (HOSPITAL_COMMUNITY)
Admission: RE | Admit: 2022-01-16 | Discharge: 2022-01-16 | Disposition: A | Discharge status: Home / Self Care | Payer: Medicare Other | Attending: Internal Medicine | Admitting: Internal Medicine

## 2022-01-16 DIAGNOSIS — E782 Mixed hyperlipidemia: Secondary | ICD-10-CM | POA: Diagnosis not present

## 2022-01-16 DIAGNOSIS — K219 Gastro-esophageal reflux disease without esophagitis: Secondary | ICD-10-CM | POA: Insufficient documentation

## 2022-01-16 DIAGNOSIS — I25110 Atherosclerotic heart disease of native coronary artery with unstable angina pectoris: Secondary | ICD-10-CM | POA: Diagnosis present

## 2022-01-16 DIAGNOSIS — Z7984 Long term (current) use of oral hypoglycemic drugs: Secondary | ICD-10-CM | POA: Diagnosis not present

## 2022-01-16 DIAGNOSIS — Z79899 Other long term (current) drug therapy: Secondary | ICD-10-CM | POA: Diagnosis not present

## 2022-01-16 DIAGNOSIS — I200 Unstable angina: Secondary | ICD-10-CM | POA: Diagnosis present

## 2022-01-16 DIAGNOSIS — Z955 Presence of coronary angioplasty implant and graft: Secondary | ICD-10-CM | POA: Diagnosis not present

## 2022-01-16 DIAGNOSIS — E1140 Type 2 diabetes mellitus with diabetic neuropathy, unspecified: Secondary | ICD-10-CM | POA: Insufficient documentation

## 2022-01-16 DIAGNOSIS — I10 Essential (primary) hypertension: Secondary | ICD-10-CM | POA: Insufficient documentation

## 2022-01-16 DIAGNOSIS — Z7982 Long term (current) use of aspirin: Secondary | ICD-10-CM | POA: Insufficient documentation

## 2022-01-16 HISTORY — PX: LEFT HEART CATH AND CORONARY ANGIOGRAPHY: CATH118249

## 2022-01-16 HISTORY — PX: CORONARY STENT INTERVENTION: CATH118234

## 2022-01-16 HISTORY — PX: INTRAVASCULAR ULTRASOUND/IVUS: CATH118244

## 2022-01-16 LAB — GLUCOSE, CAPILLARY: Glucose-Capillary: 145 mg/dL — ABNORMAL HIGH (ref 70–99)

## 2022-01-16 SURGERY — LEFT HEART CATH AND CORONARY ANGIOGRAPHY
Anesthesia: LOCAL

## 2022-01-16 MED ORDER — HEPARIN (PORCINE) IN NACL 1000-0.9 UT/500ML-% IV SOLN
INTRAVENOUS | Status: AC
  Filled 2022-01-16: qty 500

## 2022-01-16 MED ORDER — METFORMIN HCL ER 500 MG PO TB24: 1000.0000 mg | ORAL_TABLET | Freq: Every day | ORAL | Status: AC

## 2022-01-16 MED ORDER — MIDAZOLAM HCL 2 MG/2ML IJ SOLN
INTRAMUSCULAR | Status: DC | PRN
  Administered 2022-01-16 (×2): 1 mg via INTRAVENOUS

## 2022-01-16 MED ORDER — HYDRALAZINE HCL 20 MG/ML IJ SOLN: 10.0000 mg | INTRAMUSCULAR | Status: DC | PRN

## 2022-01-16 MED ORDER — FENTANYL CITRATE (PF) 100 MCG/2ML IJ SOLN
INTRAMUSCULAR | Status: AC
  Filled 2022-01-16: qty 2

## 2022-01-16 MED ORDER — SODIUM CHLORIDE 0.9% FLUSH: 3.0000 mL | Freq: Two times a day (BID) | INTRAVENOUS | Status: DC

## 2022-01-16 MED ORDER — IOHEXOL 350 MG/ML SOLN
INTRAVENOUS | Status: DC | PRN
  Administered 2022-01-16: 200 mL

## 2022-01-16 MED ORDER — ASPIRIN 81 MG PO CHEW: 81.0000 mg | CHEWABLE_TABLET | ORAL | Status: DC

## 2022-01-16 MED ORDER — FAMOTIDINE IN NACL 20-0.9 MG/50ML-% IV SOLN
INTRAVENOUS | Status: DC | PRN
  Administered 2022-01-16: 20 mg via INTRAVENOUS

## 2022-01-16 MED ORDER — NITROGLYCERIN 1 MG/10 ML FOR IR/CATH LAB
INTRA_ARTERIAL | Status: DC | PRN
  Administered 2022-01-16 (×3): 200 ug via INTRACORONARY

## 2022-01-16 MED ORDER — HEPARIN SODIUM (PORCINE) 1000 UNIT/ML IJ SOLN
INTRAMUSCULAR | Status: AC
  Filled 2022-01-16: qty 10

## 2022-01-16 MED ORDER — HEPARIN SODIUM (PORCINE) 1000 UNIT/ML IJ SOLN
INTRAMUSCULAR | Status: DC | PRN
  Administered 2022-01-16: 3000 [IU] via INTRAVENOUS
  Administered 2022-01-16: 4500 [IU] via INTRAVENOUS
  Administered 2022-01-16: 2000 [IU] via INTRAVENOUS
  Administered 2022-01-16: 3000 [IU] via INTRAVENOUS
  Administered 2022-01-16: 4500 [IU] via INTRAVENOUS

## 2022-01-16 MED ORDER — CLOPIDOGREL BISULFATE 300 MG PO TABS
ORAL_TABLET | ORAL | Status: DC | PRN
  Administered 2022-01-16: 600 mg via ORAL

## 2022-01-16 MED ORDER — VERAPAMIL HCL 2.5 MG/ML IV SOLN
INTRAVENOUS | Status: AC
  Filled 2022-01-16: qty 2

## 2022-01-16 MED ORDER — ACETAMINOPHEN 325 MG PO TABS: 650.0000 mg | ORAL_TABLET | ORAL | Status: DC | PRN

## 2022-01-16 MED ORDER — FAMOTIDINE IN NACL 20-0.9 MG/50ML-% IV SOLN
INTRAVENOUS | Status: AC
  Filled 2022-01-16: qty 50

## 2022-01-16 MED ORDER — LABETALOL HCL 5 MG/ML IV SOLN: 10.0000 mg | INTRAVENOUS | Status: DC | PRN

## 2022-01-16 MED ORDER — NITROGLYCERIN 1 MG/10 ML FOR IR/CATH LAB
INTRA_ARTERIAL | Status: AC
  Filled 2022-01-16: qty 10

## 2022-01-16 MED ORDER — CLOPIDOGREL BISULFATE 300 MG PO TABS
ORAL_TABLET | ORAL | Status: AC
  Filled 2022-01-16: qty 2

## 2022-01-16 MED ORDER — HEPARIN (PORCINE) IN NACL 1000-0.9 UT/500ML-% IV SOLN
INTRAVENOUS | Status: DC | PRN
  Administered 2022-01-16 (×3): 500 mL

## 2022-01-16 MED ORDER — CLOPIDOGREL BISULFATE 75 MG PO TABS: 75.0000 mg | ORAL_TABLET | Freq: Every day | ORAL | Status: DC

## 2022-01-16 MED ORDER — SODIUM CHLORIDE 0.9 % IV SOLN: 250.0000 mL | INTRAVENOUS | Status: DC | PRN

## 2022-01-16 MED ORDER — VERAPAMIL HCL 2.5 MG/ML IV SOLN
INTRAVENOUS | Status: DC | PRN
  Administered 2022-01-16: 10 mL via INTRA_ARTERIAL

## 2022-01-16 MED ORDER — LIDOCAINE HCL (PF) 1 % IJ SOLN
INTRAMUSCULAR | Status: AC
  Filled 2022-01-16: qty 30

## 2022-01-16 MED ORDER — ASPIRIN 81 MG PO CHEW: 81.0000 mg | CHEWABLE_TABLET | Freq: Every day | ORAL | Status: DC

## 2022-01-16 MED ORDER — CLOPIDOGREL BISULFATE 75 MG PO TABS
75.0000 mg | ORAL_TABLET | Freq: Every day | ORAL | 0 refills | Status: DC
  Filled 2022-01-16: qty 30, 30d supply, fill #0

## 2022-01-16 MED ORDER — FENTANYL CITRATE (PF) 100 MCG/2ML IJ SOLN
INTRAMUSCULAR | Status: DC | PRN
  Administered 2022-01-16: 25 ug via INTRAVENOUS
  Administered 2022-01-16: 50 ug via INTRAVENOUS

## 2022-01-16 MED ORDER — LIDOCAINE HCL (PF) 1 % IJ SOLN
INTRAMUSCULAR | Status: DC | PRN
  Administered 2022-01-16: 2 mL

## 2022-01-16 MED ORDER — SODIUM CHLORIDE 0.9 % WEIGHT BASED INFUSION
3.0000 mL/kg/h | INTRAVENOUS | Status: AC
  Administered 2022-01-16: 3 mL/kg/h via INTRAVENOUS

## 2022-01-16 MED ORDER — SODIUM CHLORIDE 0.9 % WEIGHT BASED INFUSION: 1.0000 mL/kg/h | INTRAVENOUS | Status: DC

## 2022-01-16 MED ORDER — SODIUM CHLORIDE 0.9% FLUSH: 3.0000 mL | INTRAVENOUS | Status: DC | PRN

## 2022-01-16 MED ORDER — MIDAZOLAM HCL 2 MG/2ML IJ SOLN
INTRAMUSCULAR | Status: AC
  Filled 2022-01-16: qty 2

## 2022-01-16 MED ORDER — HEPARIN (PORCINE) IN NACL 1000-0.9 UT/500ML-% IV SOLN
INTRAVENOUS | Status: AC
  Filled 2022-01-16: qty 1000

## 2022-01-16 MED ORDER — SODIUM CHLORIDE 0.9 % IV SOLN: INTRAVENOUS | Status: DC

## 2022-01-16 MED ORDER — ONDANSETRON HCL 4 MG/2ML IJ SOLN: 4.0000 mg | Freq: Four times a day (QID) | INTRAMUSCULAR | Status: DC | PRN

## 2022-01-16 SURGICAL SUPPLY — 24 items
BALLN SAPPHIRE 2.0X12 (BALLOONS) ×2
BALLN SAPPHIRE ~~LOC~~ 2.0X12 (BALLOONS) ×1 IMPLANT
BALLN SAPPHIRE ~~LOC~~ 3.0X18 (BALLOONS) ×1 IMPLANT
BALLN SAPPHIRE ~~LOC~~ 3.5X8 (BALLOONS) ×1 IMPLANT
BALLN SCOREFLEX 2.50X15 (BALLOONS) ×2
BALLOON SAPPHIRE 2.0X12 (BALLOONS) IMPLANT
BALLOON SCOREFLEX 2.50X15 (BALLOONS) IMPLANT
CATH 5FR JL3.5 JR4 ANG PIG MP (CATHETERS) ×1 IMPLANT
CATH OPTICROSS HD (CATHETERS) ×1 IMPLANT
CATH VISTA GUIDE 6FR XBLAD3.5 (CATHETERS) ×1 IMPLANT
DEVICE RAD COMP TR BAND LRG (VASCULAR PRODUCTS) ×1 IMPLANT
GLIDESHEATH SLEND SS 6F .021 (SHEATH) ×1 IMPLANT
GUIDEWIRE INQWIRE 1.5J.035X260 (WIRE) IMPLANT
INQWIRE 1.5J .035X260CM (WIRE) ×2
KIT ENCORE 26 ADVANTAGE (KITS) ×2 IMPLANT
KIT HEART LEFT (KITS) ×2 IMPLANT
PACK CARDIAC CATHETERIZATION (CUSTOM PROCEDURE TRAY) ×2 IMPLANT
SLED PULL BACK IVUS (MISCELLANEOUS) ×1 IMPLANT
STENT ONYX FRONTIER 2.75X38 (Permanent Stent) ×1 IMPLANT
SYR MEDRAD MARK 7 150ML (SYRINGE) ×2 IMPLANT
TRANSDUCER W/STOPCOCK (MISCELLANEOUS) ×2 IMPLANT
TUBING CIL FLEX 10 FLL-RA (TUBING) ×2 IMPLANT
WIRE HI TORQ BMW 190CM (WIRE) ×2 IMPLANT
WIRE RUNTHROUGH .014X180CM (WIRE) ×1 IMPLANT

## 2022-01-16 NOTE — Discharge Summary (Signed)
Discharge Summary for Same Day PCI   Patient ID: Daniel Rhodes MRN: [id number]; DOB: 10-25-1951  Admit date: 01/16/2022 Discharge date: 01/16/2022  Primary Care Provider: Veverly Fells, MD  Primary Cardiologist: Nona Dell, MD  Primary Electrophysiologist:  None   Discharge Diagnoses    Principal Problem:   Unstable angina [facility])   Diagnostic Studies/Procedures    Cardiac Catheterization 01/16/2022:  FINDINGS: Severe single-vessel CAD with 95% mid LAD stenosis involving D2 ([place] 1,1,1). Normal LVEF and LVEDP. Successful IVUS-guided bifurcation PCI using Onyx Frontier 2.75 x 38 mm drug-eluting stent in the LAD and angioplasty of ostial D2 with 0% residual stenosis in the LAD and improvement in stenosis from 90%->70% in D2 with TIMI-3 flow throughout the left coronary artery.   RECOMMENDATIONS: DAPT with aspirin and clopidogrel for at least 6 months. Aggressive secondary prevention.  Given statin intolerance, recommend consideration of PCSK9 inhibitor therapy. Anticipate same-day discharge if no post-catheterization complications following 6 hours of monitoring.   Yvonne Kendall, MD [facility] _____________   History of Present Illness     Daniel Rhodes is a 71 y.o. male with PMH of nonobstructive CAD, HTN, HLD, GERD, IDA, DM and BPV. Underwent cardiac cath back in 2017 with 60% LAD stenosis. Recently presented to the office on 1/17 with complaints of chest tightness. With his progressive symptoms, he was set up for outpatient cardiac catheterization.   Hospital Course     The patient underwent cardiac cath as noted above with severe lesion of 95% mLAD treated with PCI/DESx1, along with angioplasty of ostial D2. Plan for DAPT with ASA/plavix for at least 6 months. The patient was seen by cardiac rehab while in short stay. There were no observed complications post cath. Radial cath site was re-evaluated prior to discharge and found to be stable without any  complications. Instructions/precautions regarding cath site care were given prior to discharge.  Daniel Rhodes was seen by Dr. Okey Dupre and determined stable for discharge home. Follow up with our office has been arranged. Medications are listed below. Pertinent changes include addition of plavix. Of note he his intolerant to statins, has been referred to Lipid clinic.  _____________  Cath/PCI Registry Performance & Quality Measures: Aspirin prescribed? - Yes ADP Receptor Inhibitor (Plavix/Clopidogrel, Brilinta/Ticagrelor or Effient/Prasugrel) prescribed (includes medically managed patients)? - Yes High Intensity Statin (Lipitor 40-80mg  or Crestor 20-40mg ) prescribed? - No - intolerant, lipid clinic referral pending For EF <40%, was ACEI/ARB prescribed? - Not Applicable (EF >/= 40%) For EF <40%, Aldosterone Antagonist (Spironolactone or Eplerenone) prescribed? - Not Applicable (EF >/= 40%) Cardiac Rehab Phase II ordered (Included Medically managed Patients)? - Yes  _____________   Discharge Vitals Blood pressure 137/63, pulse 68, temperature 97.9 F (36.6 C), temperature source Oral, resp. rate 18, height 5\' 9"  (1.753 m), weight 84.8 kg, SpO2 99 %.  Filed Weights   01/16/22 0838  Weight: 84.8 kg    Last Labs & Radiologic Studies    CBC No results for input(s): WBC, NEUTROABS, HGB, HCT, MCV, PLT in the last 72 hours. Basic Metabolic Panel No results for input(s): NA, K, CL, CO2, GLUCOSE, BUN, CREATININE, CALCIUM, MG, PHOS in the last 72 hours. Liver Function Tests No results for input(s): AST, ALT, ALKPHOS, BILITOT, PROT, ALBUMIN in the last 72 hours. No results for input(s): LIPASE, AMYLASE in the last 72 hours. High Sensitivity Troponin:   No results for input(s): TROPONINIHS in the last 720 hours.  BNP Invalid input(s): POCBNP D-Dimer No results for input(s):  DDIMER in the last 72 hours. Hemoglobin A1C No results for input(s): HGBA1C in the last 72 hours. Fasting Lipid  Panel No results for input(s): CHOL, HDL, LDLCALC, TRIG, CHOLHDL, LDLDIRECT in the last 72 hours. Thyroid Function Tests No results for input(s): TSH, T4TOTAL, T3FREE, THYROIDAB in the last 72 hours.  Invalid input(s): FREET3 _____________  CARDIAC CATHETERIZATION  Result Date: 01/16/2022 Conclusions: Severe single vessel coronary artery disease with up to 95% stenosis of the mid LAD involving moderate-caliber D2 branch.  Non-obstructive LCx/OM1 disease also noted. Normal left ventricular systolic function and filling pressure. Successful complex IVUS-guided PCI to mid LAD/D2 bifurcation lesion using provisional technique.  LAD was treated with Onyx Frontier 2.75 x 38 mm drug-eluting stent excellent angiographic results.  Ostial D2 stenosis was treated with angioplasty with reduction in stenosis from 90% to 70%.  TIMI-3 flow noted throughout the left coronary artery at completion of procedure. Recommendations: Dual antiplatelet therapy with aspirin and clopidogrel for at least 6 months, ideally longer as tolerated. Aggressive secondary prevention of coronary artery disease.  Consider initiation of PCSK9 inhibitor at follow-up in the setting of statin intolerance. Medical therapy of mild-moderate LCx/OM1 disease. Anticipate same day discharge if no post-PCI complications occur during 6 hour observation period. Yvonne Kendallhristopher End, MD [facility]   Disposition   Pt is being discharged home today in good condition.  Follow-up Plans & Appointments     Follow-up Information     Jonelle SidleMcDowell, Samuel G, MD Follow up on 02/06/2022.   Specialty: Cardiology Why: at 9am for your follow up appt Contact information: [address] [place] [address] [phone]                Discharge Instructions     Amb Referral to Cardiac Rehabilitation   Complete by: As directed    [facility]   Diagnosis: Coronary Stents   After initial evaluation and assessments  completed: Virtual Based Care may be provided alone or in conjunction with Phase 2 Cardiac Rehab based on patient barriers.: Yes        Discharge Medications   Allergies as of 01/16/2022       Reactions   Statins Other (See Comments)   Elevated LFT's   Celecoxib Hives, Itching   Codeine Other (See Comments)   Skin crawling   Oxycodone-acetaminophen Itching   tylox   Simvastatin    Other reaction(s): Other (See Comments) Liver disfunction   Sulfonamide Derivatives Hives, Itching   Sulfamethoxazole Rash        Medication List     STOP taking these medications    ibuprofen 800 MG tablet Commonly known as: ADVIL       TAKE these medications    acetaminophen 500 MG tablet Commonly known as: TYLENOL Take 500 mg by mouth every 6 (six) hours as needed for moderate pain.   aspirin 81 MG tablet Take 81 mg by mouth daily.   CINNAMON PO Take 1,000 mg by mouth 2 (two) times daily.   clopidogrel 75 MG tablet Commonly known as: Plavix Take 1 tablet (75 mg total) by mouth daily. Start taking on: January 17, 2022   cyclobenzaprine 10 MG tablet Commonly known as: FLEXERIL Take 10 mg by mouth at bedtime as needed for muscle spasms.   diphenhydrAMINE 25 MG tablet Commonly known as: BENADRYL Take 25 mg by mouth daily as needed for allergies.   esomeprazole 40 MG capsule Commonly known as: NEXIUM Take 40 mg by  mouth at bedtime.   ferrous sulfate 325 (65 FE) MG tablet Take 325 mg by mouth daily.   furosemide 20 MG tablet Commonly known as: LASIX Take 20 mg by mouth daily.   glipiZIDE 10 MG tablet Commonly known as: GLUCOTROL Take 20mg  (2 tabs) by mouth every morning & 10mg  (1 tab) at suppertime   Krill Oil 500 MG Caps Take 500 mg by mouth daily.   Melatonin 10 MG Tabs Take 10 mg by mouth at bedtime.   metFORMIN 500 MG 24 hr tablet Commonly known as: GLUCOPHAGE-XR Take 2 tablets (1,000 mg total) by mouth daily with supper. Start taking on: January 19, 2022 What changed: These instructions start on January 19, 2022. If you are unsure what to do until then, ask your doctor or other care provider.   metoprolol succinate 25 MG 24 hr tablet Commonly known as: TOPROL-XL TAKE 1/2 TABLETS (12.5 MG TOTAL) BY MOUTH DAILY.   MULTIVITAMIN ADULTS 50+ PO Take 1 tablet by mouth daily.   nitroGLYCERIN 0.4 MG SL tablet Commonly known as: Nitrostat Place 1 tablet (0.4 mg total) under the tongue every 5 (five) minutes x 3 doses as needed for chest pain (if no relief after 2nd dose, proceed to the ED or call 911).   Ozempic (1 MG/DOSE) 2 MG/1.5ML Sopn Generic drug: Semaglutide (1 MG/DOSE) Inject 1 mg into the skin every Tuesday.   PREDNISOLON-MOXIFLOX-BROMFENAC OP Place 1 drop into the right eye 3 (three) times daily.   pregabalin 75 MG capsule Commonly known as: LYRICA Take 75 mg by mouth 3 (three) times daily.   SUPER B COMPLEX PO Take 1 tablet by mouth daily.   tamsulosin 0.4 MG Caps capsule Commonly known as: FLOMAX Take 0.4 mg by mouth daily after supper.   VITAMIN C PO Take 1 tablet by mouth daily.        Allergies Allergies  Allergen Reactions   Statins Other (See Comments)    Elevated LFT's   Celecoxib Hives and Itching   Codeine Other (See Comments)    Skin crawling   Oxycodone-Acetaminophen Itching    tylox   Simvastatin     Other reaction(s): Other (See Comments) Liver disfunction   Sulfonamide Derivatives Hives and Itching   Sulfamethoxazole Rash    Outstanding Labs/Studies   N/a   Duration of Discharge Encounter   Greater than 30 minutes including physician time.  Signed, January 21, 2022, NP 01/16/2022, 4:59 PM

## 2022-01-16 NOTE — Progress Notes (Signed)
CARDIAC REHAB PHASE I   Stent education completed with pt and family. Pt educated on importance of ASA and Plavix. Pt given stent card along with heart healthy and diabetic diets. Reviewed site care, restrictions, and exercise guidelines. Will refer to CRP II [place].  [id number] Reynold Bowen, RN BSN 01/16/2022 3:13 PM

## 2022-01-16 NOTE — Interval H&P Note (Signed)
History and Physical Interval Note:  01/16/2022 11:58 AM  Daniel Rhodes  has presented today for surgery, with the diagnosis of unstable angina.  The various methods of treatment have been discussed with the patient and family. After consideration of risks, benefits and other options for treatment, the patient has consented to  Procedure(s): LEFT HEART CATH AND CORONARY ANGIOGRAPHY (N/A) as a surgical intervention.  The patient's history has been reviewed, patient examined, no change in status, stable for surgery.  I have reviewed the patient's chart and labs.  Questions were answered to the patient's satisfaction.    Cath Lab Visit (complete for each Cath Lab visit)  Clinical Evaluation Leading to the Procedure:   ACS: No.  Non-ACS:    Anginal Classification: CCS IV  Anti-ischemic medical therapy: Minimal Therapy (1 class of medications)  Non-Invasive Test Results: No non-invasive testing performed  Prior CABG: No previous CABG  Tahlia Deamer

## 2022-01-16 NOTE — Brief Op Note (Signed)
BRIEF CARDIAC CATHETERIZATION NOTE  DATE: 01/16/2022  TIME: 2:07 PM  PATIENT:  Daniel Rhodes  71 y.o. male  PRE-OPERATIVE DIAGNOSIS:  Unstable angina  POST-OPERATIVE DIAGNOSIS:  Unstable angina  PROCEDURE:  Procedure(s): LEFT HEART CATH AND CORONARY ANGIOGRAPHY (N/A) CORONARY STENT INTERVENTION (N/A) Intravascular Ultrasound/IVUS (N/A)  SURGEON:  Surgeon(s) and Role:    * Kaan Tosh, MD - Primary  FINDINGS: Severe single-vessel CAD with 95% mid LAD stenosis involving D2 ([place] 1,1,1). Normal LVEF and LVEDP. Successful IVUS-guided bifurcation PCI using Onyx Frontier 2.75 x 38 mm drug-eluting stent in the LAD and angioplasty of ostial D2 with 0% residual stenosis in the LAD and improvement in stenosis from 90%->70% in D2 with TIMI-3 flow throughout the left coronary artery.  RECOMMENDATIONS: DAPT with aspirin and clopidogrel for at least 6 months. Aggressive secondary prevention.  Given statin intolerance, recommend consideration of PCSK9 inhibitor therapy. Anticipate same-day discharge if no post-catheterization complications following 6 hours of monitoring.  Yvonne Kendall, MD [facility]

## 2022-01-16 NOTE — Progress Notes (Signed)
Pt seen by Mardella Layman APP, cardiac rehab, and pharmacy prior to DC. Post EKG done. AVS reviewed with pt and wife by Kirt Boys, Charity fundraiser.

## 2022-01-17 ENCOUNTER — Encounter (HOSPITAL_COMMUNITY): Payer: Self-pay | Admitting: Internal Medicine

## 2022-01-17 ENCOUNTER — Telehealth (HOSPITAL_COMMUNITY): Payer: Self-pay

## 2022-01-17 ENCOUNTER — Other Ambulatory Visit: Payer: Self-pay | Admitting: Internal Medicine

## 2022-01-17 LAB — POCT ACTIVATED CLOTTING TIME
Activated Clotting Time: 257 seconds
Activated Clotting Time: 281 seconds
Activated Clotting Time: 287 seconds
Activated Clotting Time: 251 seconds
Activated Clotting Time: 281 seconds

## 2022-01-17 MED ORDER — CLOPIDOGREL BISULFATE 75 MG PO TABS: 75.0000 mg | ORAL_TABLET | Freq: Every day | ORAL | 3 refills | Status: DC

## 2022-01-17 NOTE — Telephone Encounter (Signed)
Per phase I cardiac rehab, fax cardiac rehab referral to [facility] cardiac rehab.

## 2022-01-21 ENCOUNTER — Telehealth: Payer: Self-pay | Admitting: Cardiology

## 2022-01-21 NOTE — Telephone Encounter (Signed)
Request order for cardiac rehab and insurance information be faxed [phone]

## 2022-01-21 NOTE — Telephone Encounter (Signed)
New Message:     Daniel Rhodes is calling.She said she need a signed order from Dr Diona Browner,  a face sheet also with his insurance card or social security  number.  She said she can fax  an order form for him to sign.

## 2022-01-23 ENCOUNTER — Telehealth (HOSPITAL_COMMUNITY): Payer: Self-pay | Admitting: Pharmacist

## 2022-01-23 ENCOUNTER — Encounter: Payer: Self-pay | Admitting: Cardiology

## 2022-01-23 ENCOUNTER — Other Ambulatory Visit (HOSPITAL_COMMUNITY): Payer: Self-pay

## 2022-01-23 NOTE — Telephone Encounter (Signed)
Pharmacy Transitions of Care Follow-up Telephone Call  Date of discharge: 01/16/22  Discharge Diagnosis: Angina, stent placement   Medication changes made at discharge:  - START: Clopidogrel  - STOPPED: IBU  - CHANGED: Metformin  Medication changes verified by the patient? yes    Medication Accessibility:  Home Pharmacy: [facility]   Was the patient provided with refills on discharged medications? No, but refills already called to home pharmacy by MD   Have all prescriptions been transferred from [facility] to home pharmacy? N/a   Is the patient able to afford medications? Part D plan Notable copays: clopidogrel generic Eligible patient assistance: n/a    Medication Review:  CLOPIDOGREL (PLAVIX) Clopidogrel 75 mg once daily.  - Educated patient on expected duration of therapy of ASA with clopidogrel for at least 1 year.  - Reviewed potential DDIs with patient  - Advised patient of medications to avoid (NSAIDs, ASA)  - Educated that Tylenol (acetaminophen) will be the preferred analgesic to prevent risk of bleeding  - Emphasized importance of monitoring for signs and symptoms of bleeding (abnormal bruising, prolonged bleeding, nose bleeds, bleeding from gums, discolored urine, black tarry stools)  - Advised patient to alert all providers of anticoagulation therapy prior to starting a new medication or having a procedure   Patent inquired about use of PCSK9inhibitors.  He is statin intolerant and MD is encouraging this as an alternative. Pt does not wish to come in for office visit for injection and has spoken to PCP about an alternative.  PCP is working on this for patient.  I have encouraged him to accept a medication for lipid control to reduce risk of future events.  Patient is hesitant due to cost and travel/office visit requirements.    Follow-up Appointments:  PCP Hospital f/u appt confirmed? Already completed PCP appointment  Specialist Hospital f/u appt confirmed?   Scheduled to see cardilogy on 02/06/22.   If their condition worsens, is the pt aware to call PCP or go to the Emergency Dept.? yes  Final Patient Assessment: Patient is doing well, reports compliance and denies bleeding issues.  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

## 2022-02-05 NOTE — Progress Notes (Signed)
Cardiology Office Note  Date: 02/06/2022   ID: [name], DOB 12-Feb-1951, MRN [id number]  PCP:  Jaynee Eagles, MD  Cardiologist:  Rozann Lesches, MD Electrophysiologist:  None   Chief Complaint  Patient presents with   Cardiac follow-up    History of Present Illness: Daniel Rhodes is a 71 y.o. male last seen in January and referred for a diagnostic cardiac catheterization in the setting of accelerating angina.   Procedure was performed by Dr. Saunders Revel with finding of severe single-vessel CAD, underwent complex IVUS guided DES intervention to the mid LAD/D2 bifurcation with otherwise plan to manage mild to moderate circumflex/OM disease medically.  He is here with his wife for a follow-up visit.  States that he feels much better with resolution of angina symptoms.  We went over his medications, he has been compliant with aspirin and Plavix.  He has pending consultation in the lipid clinic for discussion of PCSK9 inhibitors.  He has a history of statin intolerance and his last LDL was 114.  Past Medical History:  Diagnosis Date   Benign positional vertigo    BPH (benign prostatic hyperplasia)    CAD (coronary artery disease)    60% LAD April 2017 - managed medically   Diabetes mellitus with neuropathy [facility])    Essential hypertension    Family history of breast cancer 06/13/2021   Family history of lung cancer 06/13/2021   GERD (gastroesophageal reflux disease)    Hyperlipidemia    Iron deficiency anemia    Osteoarthritis    Statin intolerance     Past Surgical History:  Procedure Laterality Date   CARDIAC CATHETERIZATION N/A 03/28/2016   Procedure: Left Heart Cath and Coronary Angiography;  Surgeon: Leonie Man, MD;  Location: [place] CV LAB;  Service: Cardiovascular;  Laterality: N/A;   CARDIAC CATHETERIZATION N/A 03/28/2016   Procedure: Intravascular Pressure Wire/FFR Study;  Surgeon: Leonie Man, MD;  Location: [place] CV [facility];  Service: Cardiovascular;   Laterality: N/A;   CHOLECYSTECTOMY  2002   CORONARY STENT INTERVENTION N/A 01/16/2022   Procedure: CORONARY STENT INTERVENTION;  Surgeon: Nelva Bush, MD;  Location: [place] [facility];  Service: Cardiovascular;  Laterality: N/A;   INTRAVASCULAR ULTRASOUND/IVUS N/A 01/16/2022   Procedure: Intravascular Ultrasound/IVUS;  Surgeon: Nelva Bush, MD;  Location: [place] CV LAB;  Service: Cardiovascular;  Laterality: N/A;   LEFT HEART CATH AND CORONARY ANGIOGRAPHY N/A 01/16/2022   Procedure: LEFT HEART CATH AND CORONARY ANGIOGRAPHY;  Surgeon: Nelva Bush, MD;  Location: [place] [facility];  Service: Cardiovascular;  Laterality: N/A;   ROTATOR CUFF REPAIR Right 2008   TOE SURGERY  1963   WRIST SURGERY Left     Current Outpatient Medications  Medication Sig Dispense Refill   acetaminophen (TYLENOL) 500 MG tablet Take 500 mg by mouth every 6 (six) hours as needed for moderate pain.     Ascorbic Acid (VITAMIN C PO) Take 1 tablet by mouth daily.     aspirin 81 MG tablet Take 81 mg by mouth daily.     B Complex-C (SUPER B COMPLEX PO) Take 1 tablet by mouth daily.     CINNAMON PO Take 1,000 mg by mouth 2 (two) times daily.     clopidogrel (PLAVIX) 75 MG tablet Take 1 tablet (75 mg total) by mouth daily. 90 tablet 3   cyclobenzaprine (FLEXERIL) 10 MG tablet Take 10 mg by mouth at bedtime as needed for muscle spasms.  diphenhydrAMINE (BENADRYL) 25 MG tablet Take 25 mg by mouth daily as needed for allergies.     esomeprazole (NEXIUM) 40 MG capsule Take 40 mg by mouth at bedtime.      ferrous sulfate 325 (65 FE) MG tablet Take 325 mg by mouth daily.     furosemide (LASIX) 20 MG tablet Take 20 mg by mouth daily.     glipiZIDE (GLUCOTROL) 10 MG tablet Take 20mg  (2 tabs) by mouth every morning & 10mg  (1 tab) at suppertime     Krill Oil 500 MG CAPS Take 500 mg by mouth daily.     Melatonin 10 MG TABS Take 10 mg by mouth at bedtime.     metFORMIN (GLUCOPHAGE-XR) 500 MG 24 hr tablet Take 2  tablets (1,000 mg total) by mouth daily with supper.     metoprolol succinate (TOPROL-XL) 25 MG 24 hr tablet TAKE 1/2 TABLETS (12.5 MG TOTAL) BY MOUTH DAILY. 45 tablet 3   Multiple Vitamins-Minerals (MULTIVITAMIN ADULTS 50+ PO) Take 1 tablet by mouth daily.     nitroGLYCERIN (NITROSTAT) 0.4 MG SL tablet Place 1 tablet (0.4 mg total) under the tongue every 5 (five) minutes x 3 doses as needed for chest pain (if no relief after 2nd dose, proceed to the ED or call 911). 25 tablet 3   pregabalin (LYRICA) 75 MG capsule Take 75 mg by mouth 3 (three) times daily.     Semaglutide, 1 MG/DOSE, (OZEMPIC, 1 MG/DOSE,) 2 MG/1.5ML SOPN Inject 1 mg into the skin every Tuesday.     Tamsulosin HCl (FLOMAX) 0.4 MG CAPS Take 0.4 mg by mouth daily after supper.      PREDNISOLON-MOXIFLOX-BROMFENAC OP Place 1 drop into the right eye 3 (three) times daily. (Patient not taking: Reported on 02/06/2022)     No current facility-administered medications for this visit.   Allergies:  Statins, Bee venom, Celecoxib, Codeine, Oxycodone-acetaminophen, Simvastatin, Sulfonamide derivatives, and Sulfamethoxazole   ROS: Palpitations or syncope.  Physical Exam: VS:  BP (!) 148/80    Pulse 68    Ht 5\' 9"  (1.753 m)    Wt 194 lb 6.4 oz (88.2 kg)    SpO2 97%    BMI 28.71 kg/m , BMI Body mass index is 28.71 kg/m.  Wt Readings from Last 3 Encounters:  02/06/22 194 lb 6.4 oz (88.2 kg)  01/16/22 187 lb (84.8 kg)  01/07/22 192 lb 12.8 oz (87.5 kg)    General: Patient appears comfortable at rest. HEENT: Conjunctiva and lids normal, wearing a mask. Cardiac: Regular rate and rhythm, no S3 or significant systolic murmur, no pericardial rub. Extremities: No pitting edema, right radial arteriotomy site well-healed.  ECG:  An ECG dated 01/16/2022 was personally reviewed today and demonstrated:  Sinus rhythm with prolonged PR interval and nonspecific T wave changes.  Recent Labwork:  October 2022: Hemoglobin 14.2, platelets 231, BUN 15,  creatinine 1.14, potassium 4.4, AST 18, ALT 20, cholesterol 186, triglycerides 120, HDL 51, LDL 114 January 2023: Potassium 4.5, BUN 14, creatinine 1.11, hemoglobin 15.3, platelets 216  Other Studies Reviewed Today:  Cardiac catheterization 01/16/2022: Conclusions: Severe single vessel coronary artery disease with up to 95% stenosis of the mid LAD involving moderate-caliber D2 branch.  Non-obstructive LCx/OM1 disease also noted. Normal left ventricular systolic function and filling pressure. Successful complex IVUS-guided PCI to mid LAD/D2 bifurcation lesion using provisional technique.  LAD was treated with Onyx Frontier 2.75 x 38 mm drug-eluting stent excellent angiographic results.  Ostial D2 stenosis was treated with angioplasty  with reduction in stenosis from 90% to 70%.  TIMI-3 flow noted throughout the left coronary artery at completion of procedure.   Recommendations: Dual antiplatelet therapy with aspirin and clopidogrel for at least 6 months, ideally longer as tolerated. Aggressive secondary prevention of coronary artery disease.  Consider initiation of PCSK9 inhibitor at follow-up in the setting of statin intolerance. Medical therapy of mild-moderate LCx/OM1 disease. Anticipate same day discharge if no post-PCI complications occur during 6 hour observation period.  Assessment and Plan:  1.  CAD status post DES to mid LAD/second diagonal bifurcation with angioplasty of the second diagonal in January.  He is doing well with resolution of angina, otherwise plan to continue medical therapy for treatment of mild to moderate circumflex/obtuse marginal atherosclerosis.  Continue aspirin and Plavix, keep pending assessment in lipid clinic for discussion of PCSK9 inhibitors with known statin intolerance.  Also on Toprol-XL and as needed nitroglycerin.  2.  Mixed hyperlipidemia, recent LDL 114 with known statin intolerance due to myalgias and history of elevated LFTs.  Lipid clinic referral  pending for discussion of PCSK9 inhibitors.  Ideally goal LDL should be 55 or less.   Medication Adjustments/Labs and Tests Ordered: Current medicines are reviewed at length with the patient today.  Concerns regarding medicines are outlined above.   Tests Ordered: No orders of the defined types were placed in this encounter.   Medication Changes: No orders of the defined types were placed in this encounter.   Disposition:  Follow up  6 months.  Signed, Satira Sark, MD, [facility] 02/06/2022 9:14 AM    [place] [facility] [place], [place], [place] [address] Phone: [phone]; Fax: [phone]

## 2022-02-06 ENCOUNTER — Encounter: Payer: Self-pay | Admitting: Cardiology

## 2022-02-06 ENCOUNTER — Ambulatory Visit: Payer: Medicare Other | Admitting: Cardiology

## 2022-02-06 VITALS — BP 148/80 | HR 68 | Ht 69.0 in | Wt 194.4 lb

## 2022-02-06 DIAGNOSIS — T466X5A Adverse effect of antihyperlipidemic and antiarteriosclerotic drugs, initial encounter: Secondary | ICD-10-CM | POA: Diagnosis not present

## 2022-02-06 DIAGNOSIS — M791 Myalgia, unspecified site: Secondary | ICD-10-CM

## 2022-02-06 DIAGNOSIS — I25119 Atherosclerotic heart disease of native coronary artery with unspecified angina pectoris: Secondary | ICD-10-CM | POA: Diagnosis not present

## 2022-02-06 DIAGNOSIS — E782 Mixed hyperlipidemia: Secondary | ICD-10-CM | POA: Diagnosis not present

## 2022-02-06 NOTE — Patient Instructions (Addendum)

## 2022-02-20 ENCOUNTER — Ambulatory Visit: Payer: Medicare Other

## 2022-07-01 ENCOUNTER — Telehealth: Payer: Self-pay

## 2022-07-01 NOTE — Telephone Encounter (Signed)
   Pre-operative Risk Assessment    Patient Name: Daniel Rhodes  DOB: October 31, 1951 MRN: [id number]      Request for Surgical Clearance    Procedure:   Screening Colonoscopy  Date of Surgery:  Clearance TBD                                 Surgeon:  Dr. Charna Elizabeth Surgeon's Group or Practice Name:  [facility] Phone number:  [phone] Fax number:  [phone]   Type of Clearance Requested:   - Pharmacy:  Hold Clopidogrel (Plavix)     Type of Anesthesia:   Propofol   Additional requests/questions:   Clearance for anesthesia  Signed, Delsa Sale   07/01/2022, 2:36 PM

## 2022-07-01 NOTE — Telephone Encounter (Signed)
Dr. Diona Browner, patient had successful complex IVUS-guided PCI to mid LAD/D2 bifurcation lesion. LAD treated with 2.75 x 38 mm DES with excellent angiographic results. Ostial D2 stenosis treated with angioplasty with reduction in stenosis from 90% to 70% on 01/16/22.  Advised to continue clopidogrel and aspirin for at least 6 months, ideally longer as tolerated.  Request for clearance to hold Plavix for screening colonoscopy. Do you prefer patient to have screening portion of procedure completed and remain on DAPT or may patient hold Plavix per request since we are close to the 6 month completion of DAPT. We will schedule a telephone assessment for full clearance.  Please route your response to p cv div preop.  Thank you, Daniel Aland, NP-C    07/01/2022, 5:02 PM [facility] [address] Office [phone] Fax [phone]

## 2022-07-02 ENCOUNTER — Telehealth: Payer: Self-pay

## 2022-07-02 NOTE — Telephone Encounter (Signed)
  Patient Consent for Virtual Visit         [name] has provided verbal consent on 07/02/2022 for a virtual visit (video or telephone).   CONSENT FOR VIRTUAL VISIT FOR:  Daniel Rhodes  By participating in this virtual visit I agree to the following:  I hereby voluntarily request, consent and authorize [facility] and its employed or contracted physicians, physician assistants, nurse practitioners or other licensed health care professionals (the Practitioner), to provide me with telemedicine health care services (the "Services") as deemed necessary by the treating Practitioner. I acknowledge and consent to receive the Services by the Practitioner via telemedicine. I understand that the telemedicine visit will involve communicating with the Practitioner through live audiovisual communication technology and the disclosure of certain medical information by electronic transmission. I acknowledge that I have been given the opportunity to request an in-person assessment or other available alternative prior to the telemedicine visit and am voluntarily participating in the telemedicine visit.  I understand that I have the right to withhold or withdraw my consent to the use of telemedicine in the course of my care at any time, without affecting my right to future care or treatment, and that the Practitioner or I may terminate the telemedicine visit at any time. I understand that I have the right to inspect all information obtained and/or recorded in the course of the telemedicine visit and may receive copies of available information for a reasonable fee.  I understand that some of the potential risks of receiving the Services via telemedicine include:  Delay or interruption in medical evaluation due to technological equipment failure or disruption; Information transmitted may not be sufficient (e.g. poor resolution of images) to allow for appropriate medical decision making by the Practitioner;  and/or  In rare instances, security protocols could fail, causing a breach of personal health information.  Furthermore, I acknowledge that it is my responsibility to provide information about my medical history, conditions and care that is complete and accurate to the best of my ability. I acknowledge that Practitioner's advice, recommendations, and/or decision may be based on factors not within their control, such as incomplete or inaccurate data provided by me or distortions of diagnostic images or specimens that may result from electronic transmissions. I understand that the practice of medicine is not an exact science and that Practitioner makes no warranties or guarantees regarding treatment outcomes. I acknowledge that a copy of this consent can be made available to me via my patient portal [facility] MyChart), or I can request a printed copy by calling the office of [facility].    I understand that my insurance will be billed for this visit.   I have read or had this consent read to me. I understand the contents of this consent, which adequately explains the benefits and risks of the Services being provided via telemedicine.  I have been provided ample opportunity to ask questions regarding this consent and the Services and have had my questions answered to my satisfaction. I give my informed consent for the services to be provided through the use of telemedicine in my medical care

## 2022-07-02 NOTE — Telephone Encounter (Signed)
Patient scheduled for telehealth visit on 08/04/22 for preop clearance. Med rec and consent done

## 2022-07-02 NOTE — Telephone Encounter (Signed)
Primary Cardiologist:Samuel Diona Browner, MD   Preoperative team, please contact this patient and set up a phone call appointment for further preoperative risk assessment. Please obtain consent and complete medication review. Thank you for your help.   Per Dr. Diona Browner, patient can be scheduled after July 26 at which time he will be 6 months post PCI and Plavix could be held for 5 days.  Levi Aland, NP-C    07/02/2022, 8:47 AM  [facility] [address] Office [phone] Fax [phone]

## 2022-08-03 NOTE — Progress Notes (Signed)
Virtual Visit via Telephone Note   Because of Daniel Rhodes's co-morbid illnesses, he is at least at moderate risk for complications without adequate follow up.  This format is felt to be most appropriate for this patient at this time.  The patient did not have access to video technology/had technical difficulties with video requiring transitioning to audio format only (telephone).  All issues noted in this document were discussed and addressed.  No physical exam could be performed with this format.  Please refer to the patient's chart for his consent to telehealth for [facility].  Evaluation Performed:  Preoperative Cardiovascular Risk Assessment _____________   Date:  08/04/2022   Patient ID:  Daniel Rhodes, DOB [birth year], MRN [id number] Patient Location:  Home Provider location:   Office  Primary Care Provider:  Veverly Fells, MD Primary Cardiologist:  Nona Dell, MD  Chief Complaint / Patient Profile   Daniel Rhodes is a 71 y.o. year old male with a history of CAD s/p complex IVUS guided PCI to mid LAD/D2 bifurcation lesion using provision technique with DES to LAD and angioplasty of ostial D2 in 12/2021, hypertension, hyperlipidemia, type 2 diabetes mellitus, GERD, iron deficiency anemia, BPH, and vertigo who is pending a screening colonscopy and presents today for telephonic preoperative cardiovascular risk assessment.  Past Medical History    Past Medical History:  Diagnosis Date   Benign positional vertigo    BPH (benign prostatic hyperplasia)    CAD (coronary artery disease)    60% LAD April 2017 - managed medically   Diabetes mellitus with neuropathy (HCC)    Essential hypertension    Family history of breast cancer 06/13/2021   Family history of lung cancer 06/13/2021   GERD (gastroesophageal reflux disease)    Hyperlipidemia    Iron deficiency anemia    Osteoarthritis    Statin intolerance    Past Surgical History:  Procedure Laterality Date    CARDIAC CATHETERIZATION N/A 03/28/2016   Procedure: Left Heart Cath and Coronary Angiography;  Surgeon: Marykay Lex, MD;  Location: [facility] INVASIVE CV LAB;  Service: Cardiovascular;  Laterality: N/A;   CARDIAC CATHETERIZATION N/A 03/28/2016   Procedure: Intravascular Pressure Wire/FFR Study;  Surgeon: Marykay Lex, MD;  Location: [facility] INVASIVE CV LAB;  Service: Cardiovascular;  Laterality: N/A;   CHOLECYSTECTOMY  2002   CORONARY STENT INTERVENTION N/A 01/16/2022   Procedure: CORONARY STENT INTERVENTION;  Surgeon: Yvonne Kendall, MD;  Location: [facility] INVASIVE CV LAB;  Service: Cardiovascular;  Laterality: N/A;   INTRAVASCULAR ULTRASOUND/IVUS N/A 01/16/2022   Procedure: Intravascular Ultrasound/IVUS;  Surgeon: Yvonne Kendall, MD;  Location: [facility] INVASIVE CV LAB;  Service: Cardiovascular;  Laterality: N/A;   LEFT HEART CATH AND CORONARY ANGIOGRAPHY N/A 01/16/2022   Procedure: LEFT HEART CATH AND CORONARY ANGIOGRAPHY;  Surgeon: Yvonne Kendall, MD;  Location: [facility] INVASIVE CV LAB;  Service: Cardiovascular;  Laterality: N/A;   ROTATOR CUFF REPAIR Right 2008   TOE SURGERY  1963   WRIST SURGERY Left     Allergies  Allergies  Allergen Reactions   Statins Other (See Comments)    Elevated LFT's   Bee Venom Swelling   Celecoxib Hives and Itching   Codeine Other (See Comments)    Skin crawling   Oxycodone-Acetaminophen Itching    tylox   Simvastatin     Other reaction(s): Other (See Comments) Liver disfunction   Sulfonamide Derivatives Hives and Itching   Sulfamethoxazole Rash    History of Present Illness    Daniel  ALMON Rhodes is a 71 y.o. male who presents via audio/video conferencing for a telehealth visit today.  Patient was last seen in our office on 02/06/2022 by Dr. Diona Browner.  At that time, he was doing well with resolution of angina after his recent PCI. He is now pending procedure as outlined above. Since his last visit, he has been doing well from a cardiac standpoint. He denies any chest  pain, shortness of breath, orthopnea, PND, palpitations, lightheadedness/dizziness, syncope. He has had some swelling in his feet the past couple of months but states this seemed to resolve about 1 week ago. He stays active and is easily able to complete >4.0 METs (he just recently got back from a mission trip).  Home Medications    Prior to Admission medications   Medication Sig Start Date End Date Taking? Authorizing Provider  acetaminophen (TYLENOL) 500 MG tablet Take 500 mg by mouth every 6 (six) hours as needed for moderate pain.    [provider]  Ascorbic Acid (VITAMIN C PO) Take 1 tablet by mouth daily.    [provider]  aspirin 81 MG tablet Take 81 mg by mouth daily.    [provider]  B Complex-C (SUPER B COMPLEX PO) Take 1 tablet by mouth daily.    [provider]  CINNAMON PO Take 1,000 mg by mouth 2 (two) times daily.    [provider]  clopidogrel (PLAVIX) 75 MG tablet Take 1 tablet (75 mg total) by mouth daily. 01/17/22   End, Cristal Deer, MD  cyclobenzaprine (FLEXERIL) 10 MG tablet Take 10 mg by mouth at bedtime as needed for muscle spasms.  Patient not taking: Reported on 07/02/2022    [provider]  cycloSPORINE (RESTASIS) 0.05 % ophthalmic emulsion Place 1 drop into both eyes 2 (two) times daily.    [provider]  diphenhydrAMINE (BENADRYL) 25 MG tablet Take 25 mg by mouth daily as needed for allergies.    [provider]  esomeprazole (NEXIUM) 40 MG capsule Take 40 mg by mouth at bedtime.     [provider]  ferrous sulfate 325 (65 FE) MG tablet Take 325 mg by mouth daily.    [provider]  furosemide (LASIX) 20 MG tablet Take 20 mg by mouth daily. 08/17/20   [provider]  glipiZIDE (GLUCOTROL) 10 MG tablet Take 20mg  (2 tabs) by mouth every morning & 10mg  (1 tab) at suppertime    [provider]  Oil 500 MG CAPS Take 500 mg by mouth daily. Patient not  taking: Reported on 07/02/2022    [provider]  Melatonin 10 MG TABS Take 10 mg by mouth at bedtime.    [provider]  metFORMIN (GLUCOPHAGE-XR) 500 MG 24 hr tablet Take 2 tablets (1,000 mg total) by mouth daily with supper. 01/19/22   End, 09/02/2022, MD  metoprolol succinate (TOPROL-XL) 25 MG 24 hr tablet TAKE 1/2 TABLETS (12.5 MG TOTAL) BY MOUTH DAILY. 02/20/21   Cristal Deer, MD  Multiple Vitamins-Minerals (MULTIVITAMIN ADULTS 50+ PO) Take 1 tablet by mouth daily.    [provider]  nitroGLYCERIN (NITROSTAT) 0.4 MG SL tablet Place 1 tablet (0.4 mg total) under the tongue every 5 (five) minutes x 3 doses as needed for chest pain (if no relief after 2nd dose, proceed to the ED or call 911). 01/07/22   Jonelle Sidle, MD  PREDNISOLON-MOXIFLOX-BROMFENAC OP Place 1 drop into the right eye 3 (three) times daily. Patient not  taking: Reported on 07/02/2022    [provider]  pregabalin (LYRICA) 75 MG capsule Take 75 mg by mouth 3 (three) times daily. 12/30/21   [provider]  Semaglutide, 1 MG/DOSE, (OZEMPIC, 1 MG/DOSE,) 2 MG/1.5ML SOPN Inject 1 mg into the skin every Tuesday.    [provider]  Tamsulosin HCl (FLOMAX) 0.4 MG CAPS Take 0.4 mg by mouth daily after supper.     [provider]    Physical Exam    Vital Signs:  Daniel Rhodes does not have vital signs available for review today.  Given telephonic nature of communication, physical exam is limited. Alert and oriented x3. No acute distress. Normal affect. Speech and respirations are unlabored.  Accessory Clinical Findings    None  Assessment & Plan    1.  Preoperative Cardiovascular Risk Assessment: Patient has upcoming colonoscopy planned. He is doing well from a cardiac standpoint with no angina, shortness of breath, acute CHF symptoms, palpitations, or syncope. He is able to complete >4.0 METS without any problems. Therefore, based on ACC/AHA guidelines,  patient would be at acceptable risk for the planned procedure without further cardiovascular testing. Per Dr. Diona Browner, it is OK to hold Plavix for 5 days for procedure now that he is >6 months out from PCI. Please restart this as soon as safely possible following the procedure.  I will route this recommendation to the requesting party via Epic fax function.  A copy of this note will be routed to requesting surgeon.  Time:   Today, I have spent 4 minutes and 28 seconds with the patient with telehealth technology discussing medical history, symptoms, and management plan.     Corrin Parker, PA-C  08/04/2022, 9:07 AM

## 2022-08-04 ENCOUNTER — Ambulatory Visit (INDEPENDENT_AMBULATORY_CARE_PROVIDER_SITE_OTHER): Payer: Medicare Other | Admitting: Student

## 2022-08-04 DIAGNOSIS — Z01810 Encounter for preprocedural cardiovascular examination: Secondary | ICD-10-CM | POA: Diagnosis not present

## 2022-08-14 ENCOUNTER — Ambulatory Visit: Payer: Medicare Other | Admitting: Cardiology

## 2022-09-07 NOTE — Progress Notes (Unsigned)
Cardiology Office Note  Date: 09/08/2022   ID: Daniel Rhodes, DOB 1951/10/14, MRN [id number]  PCP:  Veverly Fells, MD  Cardiologist:  Nona Dell, MD Electrophysiologist:  None   Chief Complaint  Patient presents with   Cardiac follow-up    History of Present Illness: Daniel Rhodes is a 71 y.o. male last seen in February.  He is here for a follow-up visit.  Reports no angina or nitroglycerin use, no change in stamina.  We discussed the results of his cardiac catheterization and intervention from January.  We again discussed referral to lipid clinic for discussion of PCSK9 inhibitors given history of statin intolerance and also elevated LFTs.  He really would prefer not to have to travel to [place] regularly and we will try to set this up virtually.  I reviewed his medications which are outlined below.  He remains on dual antiplatelet therapy, no spontaneous bleeding problems.   Past Medical History:  Diagnosis Date   Benign positional vertigo    BPH (benign prostatic hyperplasia)    CAD (coronary artery disease)    DES to mid LAD/D2 bifurcation 12/2021   Diabetes mellitus with neuropathy (HCC)    Essential hypertension    Family history of breast cancer 06/13/2021   Family history of lung cancer 06/13/2021   GERD (gastroesophageal reflux disease)    Hyperlipidemia    Iron deficiency anemia    Osteoarthritis    Statin intolerance     Past Surgical History:  Procedure Laterality Date   CARDIAC CATHETERIZATION N/A 03/28/2016   Procedure: Left Heart Cath and Coronary Angiography;  Surgeon: Marykay Lex, MD;  Location: [facility] INVASIVE CV LAB;  Service: Cardiovascular;  Laterality: N/A;   CARDIAC CATHETERIZATION N/A 03/28/2016   Procedure: Intravascular Pressure Wire/FFR Study;  Surgeon: Marykay Lex, MD;  Location: [facility] INVASIVE CV LAB;  Service: Cardiovascular;  Laterality: N/A;   CHOLECYSTECTOMY  2002   CORONARY STENT INTERVENTION N/A 01/16/2022   Procedure:  CORONARY STENT INTERVENTION;  Surgeon: Yvonne Kendall, MD;  Location: [facility] INVASIVE CV LAB;  Service: Cardiovascular;  Laterality: N/A;   INTRAVASCULAR ULTRASOUND/IVUS N/A 01/16/2022   Procedure: Intravascular Ultrasound/IVUS;  Surgeon: Yvonne Kendall, MD;  Location: [facility] INVASIVE CV LAB;  Service: Cardiovascular;  Laterality: N/A;   LEFT HEART CATH AND CORONARY ANGIOGRAPHY N/A 01/16/2022   Procedure: LEFT HEART CATH AND CORONARY ANGIOGRAPHY;  Surgeon: Yvonne Kendall, MD;  Location: [facility] INVASIVE CV LAB;  Service: Cardiovascular;  Laterality: N/A;   ROTATOR CUFF REPAIR Right 2008   TOE SURGERY  1963   WRIST SURGERY Left     Current Outpatient Medications  Medication Sig Dispense Refill   acetaminophen (TYLENOL) 500 MG tablet Take 500 mg by mouth every 6 (six) hours as needed for moderate pain.     Ascorbic Acid (VITAMIN C PO) Take 1 tablet by mouth daily.     aspirin 81 MG tablet Take 81 mg by mouth daily.     B Complex-C (SUPER B COMPLEX PO) Take 1 tablet by mouth daily.     CINNAMON PO Take 1,000 mg by mouth 2 (two) times daily.     clopidogrel (PLAVIX) 75 MG tablet Take 1 tablet (75 mg total) by mouth daily. 90 tablet 3   cyclobenzaprine (FLEXERIL) 10 MG tablet Take 10 mg by mouth at bedtime as needed for muscle spasms.     cycloSPORINE (RESTASIS) 0.05 % ophthalmic emulsion Place 1 drop into both eyes 2 (two) times daily.  diphenhydrAMINE (BENADRYL) 25 MG tablet Take 25 mg by mouth daily as needed for allergies.     esomeprazole (NEXIUM) 40 MG capsule Take 40 mg by mouth at bedtime.      furosemide (LASIX) 20 MG tablet Take 20 mg by mouth daily.     glipiZIDE (GLUCOTROL) 10 MG tablet Take 20mg  (2 tabs) by mouth every morning & 10mg  (1 tab) at suppertime     Melatonin 10 MG TABS Take 10 mg by mouth at bedtime.     metFORMIN (GLUCOPHAGE-XR) 500 MG 24 hr tablet Take 2 tablets (1,000 mg total) by mouth daily with supper.     metoprolol succinate (TOPROL-XL) 25 MG 24 hr tablet TAKE 1/2  TABLETS (12.5 MG TOTAL) BY MOUTH DAILY. 45 tablet 3   Multiple Vitamins-Minerals (MULTIVITAMIN ADULTS 50+ PO) Take 1 tablet by mouth daily.     nitroGLYCERIN (NITROSTAT) 0.4 MG SL tablet Place 1 tablet (0.4 mg total) under the tongue every 5 (five) minutes x 3 doses as needed for chest pain (if no relief after 2nd dose, proceed to the ED or call 911). 25 tablet 3   pregabalin (LYRICA) 75 MG capsule Take 75 mg by mouth 3 (three) times daily.     Semaglutide, 1 MG/DOSE, (OZEMPIC, 1 MG/DOSE,) 2 MG/1.5ML SOPN Inject 1 mg into the skin every Tuesday.     Tamsulosin HCl (FLOMAX) 0.4 MG CAPS Take 0.4 mg by mouth daily after supper.      No current facility-administered medications for this visit.   Allergies:  Statins, Bee venom, Celecoxib, Codeine, Oxycodone-acetaminophen, Simvastatin, Sulfonamide derivatives, and Sulfamethoxazole   ROS: No palpitations or syncope.  Physical Exam: VS:  BP 124/72   Pulse 70   Ht 5\' 9"  (1.753 m)   Wt 193 lb 12.8 oz (87.9 kg)   SpO2 97%   BMI 28.62 kg/m , BMI Body mass index is 28.62 kg/m.  Wt Readings from Last 3 Encounters:  09/08/22 193 lb 12.8 oz (87.9 kg)  02/06/22 194 lb 6.4 oz (88.2 kg)  01/16/22 187 lb (84.8 kg)    General: Patient appears comfortable at rest. HEENT: Conjunctiva and lids normal. Neck: Supple, no elevated JVP or carotid bruits, no thyromegaly. Lungs: Clear to auscultation, nonlabored breathing at rest. Cardiac: Regular rate and rhythm, no S3 or significant systolic murmur, no pericardial rub. Extremities: No pitting edema.  ECG:  An ECG dated 01/16/2022 was personally reviewed today and demonstrated:  Sinus rhythm with prolonged PR interval and nonspecific T wave changes.  Recent Labwork:  October 2022: Hemoglobin 14.2, platelets 231, BUN 15, creatinine 1.14, potassium 4.4, AST 18, ALT 20, cholesterol 186, triglycerides 120, HDL 51, LDL 114 January 2023: Potassium 4.5, BUN 14, creatinine 1.11, hemoglobin 15.3, platelets  216  Other Studies Reviewed Today:  Cardiac catheterization 01/16/2022: Conclusions: Severe single vessel coronary artery disease with up to 95% stenosis of the mid LAD involving moderate-caliber D2 branch.  Non-obstructive LCx/OM1 disease also noted. Normal left ventricular systolic function and filling pressure. Successful complex IVUS-guided PCI to mid LAD/D2 bifurcation lesion using provisional technique.  LAD was treated with Onyx Frontier 2.75 x 38 mm drug-eluting stent excellent angiographic results.  Ostial D2 stenosis was treated with angioplasty with reduction in stenosis from 90% to 70%.  TIMI-3 flow noted throughout the left coronary artery at completion of procedure.   Recommendations: Dual antiplatelet therapy with aspirin and clopidogrel for at least 6 months, ideally longer as tolerated. Aggressive secondary prevention of coronary artery disease.  Consider initiation  of PCSK9 inhibitor at follow-up in the setting of statin intolerance. Medical therapy of mild-moderate LCx/OM1 disease. Anticipate same day discharge if no post-PCI complications occur during 6 hour observation period.  Assessment and Plan:  1.  CAD status post complex PCI with DES to the mid LAD/second diagonal bifurcation and angioplasty of the second diagonal in January of this year.  He is doing well without active angina or nitroglycerin use.  Mild to moderate circumflex/obtuse marginal stenosis being managed medically.  Plan to continue aspirin and Plavix, also Toprol-XL and as needed nitroglycerin.  We are referring him back to lipid clinic for virtual encounter regarding PCSK9 inhibitors.  2.  Statin intolerance with mixed hyperlipidemia, prior history of myalgias and also elevated LFTs on statin therapy.  Last LDL was 114.  Referral made for virtual encounter in the lipid clinic for discussion of PCSK9 inhibitors.  Medication Adjustments/Labs and Tests Ordered: Current medicines are reviewed at length with  the patient today.  Concerns regarding medicines are outlined above.   Tests Ordered: Orders Placed This Encounter  Procedures   AMB Referral to [facility]    Medication Changes: No orders of the defined types were placed in this encounter.   Disposition:  Follow up  6 months.  Signed, Jonelle Sidle, MD, [facility] 09/08/2022 8:52 AM    [facility] at [facility] [address] [place], [place], [place] [address] Phone: [phone]; Fax: [phone]

## 2022-09-08 ENCOUNTER — Ambulatory Visit: Payer: Medicare Other | Attending: Cardiology | Admitting: Cardiology

## 2022-09-08 ENCOUNTER — Encounter: Payer: Self-pay | Admitting: Cardiology

## 2022-09-08 VITALS — BP 124/72 | HR 70 | Ht 69.0 in | Wt 193.8 lb

## 2022-09-08 DIAGNOSIS — Z789 Other specified health status: Secondary | ICD-10-CM | POA: Diagnosis not present

## 2022-09-08 DIAGNOSIS — I25119 Atherosclerotic heart disease of native coronary artery with unspecified angina pectoris: Secondary | ICD-10-CM

## 2022-09-08 DIAGNOSIS — E782 Mixed hyperlipidemia: Secondary | ICD-10-CM

## 2022-09-08 DIAGNOSIS — M791 Myalgia, unspecified site: Secondary | ICD-10-CM | POA: Diagnosis not present

## 2022-09-08 DIAGNOSIS — T466X5A Adverse effect of antihyperlipidemic and antiarteriosclerotic drugs, initial encounter: Secondary | ICD-10-CM

## 2022-09-08 DIAGNOSIS — T466X5D Adverse effect of antihyperlipidemic and antiarteriosclerotic drugs, subsequent encounter: Secondary | ICD-10-CM

## 2022-09-08 NOTE — Patient Instructions (Signed)
Medication Instructions:  °Your physician recommends that you continue on your current medications as directed. Please refer to the Current Medication list given to you today. ° °Labwork: °none ° °Testing/Procedures: °none ° °Follow-Up: °Your physician recommends that you schedule a follow-up appointment in: 6 months ° °Any Other Special Instructions Will Be Listed Below (If Applicable). °You have been referred to the Lipid Clinic ° °If you need a refill on your cardiac medications before your next appointment, please call your pharmacy. °

## 2022-10-15 ENCOUNTER — Other Ambulatory Visit: Payer: Self-pay

## 2022-10-16 ENCOUNTER — Ambulatory Visit: Payer: Medicare Other | Attending: Cardiology | Admitting: Pharmacist

## 2022-10-16 DIAGNOSIS — I200 Unstable angina: Secondary | ICD-10-CM

## 2022-10-16 DIAGNOSIS — Z955 Presence of coronary angioplasty implant and graft: Secondary | ICD-10-CM | POA: Diagnosis not present

## 2022-10-16 DIAGNOSIS — E782 Mixed hyperlipidemia: Secondary | ICD-10-CM

## 2022-10-16 NOTE — Patient Instructions (Signed)
It was nice meeting you today  We would like your LDL (bad cholesterol) to be less than 55  We would like to start a new medication called Repatha, which you will inject once every 2 weeks  I will complete the prior authorization for you and contact you when it is approved  I will also complete the patient assistance paperwork for you and send that to the manufacturer  We would like to recheck your cholesterol in 2-3 months after you come back from your trip  Please let me know if you have any questions  Karren Cobble, PharmD, [place] [place] [address], [place], [place], [address] Phone: [phone], Fax: [phone]

## 2022-10-16 NOTE — Progress Notes (Unsigned)
Patient ID: Daniel Rhodes                 DOB: 08-05-1951                    MRN: [id number]     HPI: Daniel Rhodes is a 71 y.o. male patient referred to lipid clinic by Dr Diona Browner. PMH is significant for CAD, angina, HTN, T2DM with neuropathy, HLD, and statin intolerance.   Patient presents today with wife. Had been resistant to visiting lipid clinic since he lives a long drive from [place] and was not sure what the visit would entail.  Is also going on a mission trip to [place] next month.  Has tried multiple statins in the past which caused myalgias and elevated LFTs. Chart has rosuvastatin and ezetimibe listed as past therapies but wife reports he had been on others.  Is not following a diet. Reports he eats whatever he wants. Last A1c 7.4.  Currently on metformin, Ozempic, and glipizide. Ozempic dose has not been titrated recently. He dislikes metformin because he believes it is causing his ED.  Current Medications: N/A  Intolerances:  Rosuvastatin Ezetimibe Simvastatin  Risk Factors:  CAD T2DM  LDL goal: <55  Labs: LDL 114  Past Medical History:  Diagnosis Date   Benign positional vertigo    BPH (benign prostatic hyperplasia)    CAD (coronary artery disease)    DES to mid LAD/D2 bifurcation 12/2021   Diabetes mellitus with neuropathy (HCC)    Essential hypertension    Family history of breast cancer 06/13/2021   Family history of lung cancer 06/13/2021   GERD (gastroesophageal reflux disease)    Hyperlipidemia    Iron deficiency anemia    Osteoarthritis    Statin intolerance     Current Outpatient Medications on File Prior to Visit  Medication Sig Dispense Refill   acetaminophen (TYLENOL) 500 MG tablet Take 500 mg by mouth every 6 (six) hours as needed for moderate pain.     Ascorbic Acid (VITAMIN C PO) Take 1 tablet by mouth daily.     aspirin 81 MG tablet Take 81 mg by mouth daily.     B Complex-C (SUPER B COMPLEX PO) Take 1 tablet by mouth daily.      CINNAMON PO Take 1,000 mg by mouth 2 (two) times daily.     clopidogrel (PLAVIX) 75 MG tablet Take 1 tablet (75 mg total) by mouth daily. 90 tablet 3   cyclobenzaprine (FLEXERIL) 10 MG tablet Take 10 mg by mouth at bedtime as needed for muscle spasms.     cycloSPORINE (RESTASIS) 0.05 % ophthalmic emulsion Place 1 drop into both eyes 2 (two) times daily.     diphenhydrAMINE (BENADRYL) 25 MG tablet Take 25 mg by mouth daily as needed for allergies.     esomeprazole (NEXIUM) 40 MG capsule Take 40 mg by mouth at bedtime.      furosemide (LASIX) 20 MG tablet Take 20 mg by mouth daily.     glipiZIDE (GLUCOTROL) 10 MG tablet Take 20mg  (2 tabs) by mouth every morning & 10mg  (1 tab) at suppertime     Melatonin 10 MG TABS Take 10 mg by mouth at bedtime.     metFORMIN (GLUCOPHAGE-XR) 500 MG 24 hr tablet Take 2 tablets (1,000 mg total) by mouth daily with supper.     metoprolol succinate (TOPROL-XL) 25 MG 24 hr tablet TAKE 1/2 TABLETS (12.5 MG TOTAL) BY MOUTH DAILY. 45 tablet 3  Multiple Vitamins-Minerals (MULTIVITAMIN ADULTS 50+ PO) Take 1 tablet by mouth daily.     nitroGLYCERIN (NITROSTAT) 0.4 MG SL tablet Place 1 tablet (0.4 mg total) under the tongue every 5 (five) minutes x 3 doses as needed for chest pain (if no relief after 2nd dose, proceed to the ED or call 911). 25 tablet 3   pregabalin (LYRICA) 75 MG capsule Take 75 mg by mouth 3 (three) times daily.     Semaglutide, 1 MG/DOSE, (OZEMPIC, 1 MG/DOSE,) 2 MG/1.5ML SOPN Inject 1 mg into the skin every Tuesday.     Tamsulosin HCl (FLOMAX) 0.4 MG CAPS Take 0.4 mg by mouth daily after supper.      No current facility-administered medications on file prior to visit.    Allergies  Allergen Reactions   Statins Other (See Comments)    Elevated LFT's   Bee Venom Swelling   Celecoxib Hives and Itching   Codeine Other (See Comments)    Skin crawling   Oxycodone-Acetaminophen Itching    tylox   Simvastatin     Other reaction(s): Other (See  Comments) Liver disfunction   Sulfonamide Derivatives Hives and Itching   Sulfamethoxazole Rash    Assessment/Plan:  1. Hyperlipidemia - Patient's most recent LDL 114 which is above goal of <55.  Aggressive goal chosen due to T2DM and CAD.   Patient intolerant to statins and likely some LDL elevation due to poor diet choices. Discussed diet in detail with patient. Encouraged increasing vegetables, whole grains, healthy fats, and lean proteins. Recommended avoiding sugary beverages. Advised to reach out to PCP about increasing Ozempic to max dose as it will also provide cardiac protection. Encouraged him to discuss metformin concerns as well.  Advised that patient needs aggressive lipid lowering. Using demo pen, educated patient on mechanism of action of Repatha, storage, site selection, administration, and possible adverse effects. Will complete prior authorization for patient. Patient concerned regarding cost. Will complete patient assistance application and have Dr Domenic Polite sign. Recheck lipid panel in 2-3 months.  Start Repatha 140mg  q 2 weeks  Karren Cobble, PharmD, BCACP, [place], [address], [place], [place], [address] Phone: [phone], Fax: [phone]

## 2022-10-22 ENCOUNTER — Telehealth: Payer: Self-pay | Admitting: Pharmacist

## 2022-10-22 ENCOUNTER — Encounter: Payer: Self-pay | Admitting: Pharmacist

## 2022-10-22 NOTE — Telephone Encounter (Signed)
PA for Repatha submitted. Key: [id number]. Approved through 04/21/22

## 2022-10-22 NOTE — Telephone Encounter (Signed)
Patient assistance application for Repatha completed and faxed to [employer]. Called patient and lmom.

## 2022-11-18 ENCOUNTER — Telehealth: Payer: Self-pay | Admitting: Cardiology

## 2022-11-18 NOTE — Telephone Encounter (Signed)
Patient assistance application was faxed to [employer] on 10/22/22. Do not know how long it takes them to process.  Patient can check his status by calling [phone]

## 2022-11-18 NOTE — Telephone Encounter (Signed)
Pt calling to f/u on Repatha medication. He states that insurance has approved medication but has yet to hear back from office. Please advise

## 2022-11-18 NOTE — Telephone Encounter (Signed)
Patient notified and verbalized understanding.  States that he is driving now & can not write number down nor does he use his mychart.  Informed him that Thanksgiving holiday may have slowed things down as well.  Suggested he call back to get number when he can.  He verbalized understanding.

## 2023-01-01 ENCOUNTER — Telehealth: Payer: Self-pay | Admitting: Cardiology

## 2023-01-01 NOTE — Telephone Encounter (Signed)
Dr. Domenic Polite, per office protocol, need your agreement for patient to hold Plavix for upcoming nephrectomy due to stent length > 25 mm. He is now at one year post PCI/DES of LAD with 2.75 x 38 mm DES. He is scheduled for open radical nephrectomy, date TBD. The APP team will conduct a virtual visit for medical clearance to ensure no concerning cardiac symptoms at this time.  Please direct your response to p cv div preop.  Thank you, Sharyn Lull

## 2023-01-01 NOTE — Telephone Encounter (Signed)
Primary Cardiologist:Samuel Domenic Polite, MD   Preoperative team, please contact this patient and set up a phone call appointment for further preoperative risk assessment. Please obtain consent and complete medication review. Thank you for your help.   I confirm that guidance regarding antiplatelet and oral anticoagulation therapy has been completed and, if necessary, noted below.   Emmaline Life, NP-C  01/01/2023, 3:11 PM [address] Office [phone] Fax [phone]

## 2023-01-01 NOTE — Telephone Encounter (Signed)
   Pre-operative Risk Assessment    Patient Name: Daniel Rhodes  DOB: May 05, 1951 MRN: [id number]      Request for Surgical Clearance    Procedure:   Open radical nephrectomy  Date of Surgery:  Clearance TBD                                 Surgeon:  Dr. Violet Baldy Surgeon's Group or Practice Name:  [facility] Phone number:  [phone]  Fax number:  [phone]   Type of Clearance Requested:   - Medical  - Pharmacy:  Hold Clopidogrel (Plavix)     Type of Anesthesia:  General    Additional requests/questions:   Caller stated patient will need medical and pharmacy clearance  Signed, Heloise Beecham   01/01/2023, 2:14 PM

## 2023-01-02 ENCOUNTER — Telehealth: Payer: Self-pay | Admitting: *Deleted

## 2023-01-02 NOTE — Telephone Encounter (Signed)
Pt has been scheduled for tele pre op appt 01/07/23 @ 10:20. Med rec and consent are done.     Patient Consent for Virtual Visit        Daniel Rhodes has provided verbal consent on 01/02/2023 for a virtual visit (video or telephone).   CONSENT FOR VIRTUAL VISIT FOR:  Daniel Rhodes  By participating in this virtual visit I agree to the following:  I hereby voluntarily request, consent and authorize [facility] and its employed or contracted physicians, physician assistants, nurse practitioners or other licensed health care professionals (the Practitioner), to provide me with telemedicine health care services (the "Services") as deemed necessary by the treating Practitioner. I acknowledge and consent to receive the Services by the Practitioner via telemedicine. I understand that the telemedicine visit will involve communicating with the Practitioner through live audiovisual communication technology and the disclosure of certain medical information by electronic transmission. I acknowledge that I have been given the opportunity to request an in-person assessment or other available alternative prior to the telemedicine visit and am voluntarily participating in the telemedicine visit.  I understand that I have the right to withhold or withdraw my consent to the use of telemedicine in the course of my care at any time, without affecting my right to future care or treatment, and that the Practitioner or I may terminate the telemedicine visit at any time. I understand that I have the right to inspect all information obtained and/or recorded in the course of the telemedicine visit and may receive copies of available information for a reasonable fee.  I understand that some of the potential risks of receiving the Services via telemedicine include:  Delay or interruption in medical evaluation due to technological equipment failure or disruption; Information transmitted may not be sufficient  (e.g. poor resolution of images) to allow for appropriate medical decision making by the Practitioner; and/or  In rare instances, security protocols could fail, causing a breach of personal health information.  Furthermore, I acknowledge that it is my responsibility to provide information about my medical history, conditions and care that is complete and accurate to the best of my ability. I acknowledge that Practitioner's advice, recommendations, and/or decision may be based on factors not within their control, such as incomplete or inaccurate data provided by me or distortions of diagnostic images or specimens that may result from electronic transmissions. I understand that the practice of medicine is not an exact science and that Practitioner makes no warranties or guarantees regarding treatment outcomes. I acknowledge that a copy of this consent can be made available to me via my patient portal ([facility]), or I can request a printed copy by calling the office of [facility].    I understand that my insurance will be billed for this visit.   I have read or had this consent read to me. I understand the contents of this consent, which adequately explains the benefits and risks of the Services being provided via telemedicine.  I have been provided ample opportunity to ask questions regarding this consent and the Services and have had my questions answered to my satisfaction. I give my informed consent for the services to be provided through the use of telemedicine in my medical care

## 2023-01-02 NOTE — Telephone Encounter (Signed)
Pt has been scheduled for tele pre op appt 01/07/23 @ 10:20. Med rec and consent are done.

## 2023-01-07 ENCOUNTER — Ambulatory Visit: Payer: Medicare Other | Attending: Cardiovascular Disease | Admitting: Nurse Practitioner

## 2023-01-07 DIAGNOSIS — Z01810 Encounter for preprocedural cardiovascular examination: Secondary | ICD-10-CM | POA: Diagnosis not present

## 2023-01-07 NOTE — Progress Notes (Signed)
Virtual Visit via Telephone Note   Because of Daniel Rhodes's co-morbid illnesses, he is at least at moderate risk for complications without adequate follow up.  This format is felt to be most appropriate for this patient at this time.  The patient did not have access to video technology/had technical difficulties with video requiring transitioning to audio format only (telephone).  All issues noted in this document were discussed and addressed.  No physical exam could be performed with this format.  Please refer to the patient's chart for his consent to telehealth for [facility].  Evaluation Performed:  Preoperative cardiovascular risk assessment _____________   Date:  01/07/2023   Patient ID:  Daniel Rhodes, DOB 1951-10-30, MRN [id number] Patient Location:  Home Provider location:   Office  Primary Care Provider:  Jaynee Eagles, MD Primary Cardiologist:  Rozann Lesches, MD  Chief Complaint / Patient Profile   72 y.o. y/o male with a h/o CAD s/p complex IVUS guided PCI to mid LAD/D2 bifurcation lesion using provision technique with DES to LAD and angioplasty of ostial D2 in 12/2021, hypertension, hyperlipidemia, type 2 diabetes mellitus, GERD, iron deficiency anemia, BPH, and vertigo   who is pending radical nephrectomy and presents today for telephonic preoperative cardiovascular risk assessment.  History of Present Illness    Daniel Rhodes is a 72 y.o. male who presents via audio/video conferencing for a telehealth visit today.  Pt was last seen in cardiology clinic on 09/08/2022 by Dr. Domenic Polite.  At that time GREY RAKESTRAW was doing well with no reports of angina or nitroglycerin usage..  The patient is now pending procedure as outlined above. Since his last visit, he is doing well with no cardiac complaints.  He denies chest pain, shortness of breath, lower extremity edema, fatigue, palpitations, melena, hematuria, hemoptysis, diaphoresis, weakness, presyncope,  syncope, orthopnea, and PND.    Past Medical History    Past Medical History:  Diagnosis Date   Benign positional vertigo    BPH (benign prostatic hyperplasia)    CAD (coronary artery disease)    DES to mid LAD/D2 bifurcation 12/2021   Diabetes mellitus with neuropathy ([facility])    Essential hypertension    Family history of breast cancer 06/13/2021   Family history of lung cancer 06/13/2021   GERD (gastroesophageal reflux disease)    Hyperlipidemia    Iron deficiency anemia    Osteoarthritis    Statin intolerance    Past Surgical History:  Procedure Laterality Date   CARDIAC CATHETERIZATION N/A 03/28/2016   Procedure: Left Heart Cath and Coronary Angiography;  Surgeon: Leonie Man, MD;  Location: [facility];  Service: Cardiovascular;  Laterality: N/A;   CARDIAC CATHETERIZATION N/A 03/28/2016   Procedure: Intravascular Pressure Wire/FFR Study;  Surgeon: Leonie Man, MD;  Location: [facility];  Service: Cardiovascular;  Laterality: N/A;   CHOLECYSTECTOMY  2002   CORONARY STENT INTERVENTION N/A 01/16/2022   Procedure: CORONARY STENT INTERVENTION;  Surgeon: Nelva Bush, MD;  Location: [facility];  Service: Cardiovascular;  Laterality: N/A;   INTRAVASCULAR ULTRASOUND/IVUS N/A 01/16/2022   Procedure: Intravascular Ultrasound/IVUS;  Surgeon: Nelva Bush, MD;  Location: [facility];  Service: Cardiovascular;  Laterality: N/A;   LEFT HEART CATH AND CORONARY ANGIOGRAPHY N/A 01/16/2022   Procedure: LEFT HEART CATH AND CORONARY ANGIOGRAPHY;  Surgeon: Nelva Bush, MD;  Location: [facility];  Service: Cardiovascular;  Laterality: N/A;   ROTATOR CUFF REPAIR Right 2008  TOE SURGERY  1963   WRIST SURGERY Left     Allergies  Allergies  Allergen Reactions   Statins Other (See Comments)    Elevated LFT's   Bee Venom Swelling   Celecoxib Hives and Itching   Codeine Other (See Comments)    Skin crawling   Oxycodone-Acetaminophen Itching     tylox   Simvastatin     Other reaction(s): Other (See Comments) Liver disfunction   Sulfonamide Derivatives Hives and Itching   Sulfamethoxazole Rash    Home Medications    Prior to Admission medications   Medication Sig Start Date End Date Taking? Authorizing Provider  acetaminophen (TYLENOL) 500 MG tablet Take 500 mg by mouth every 6 (six) hours as needed for moderate pain.    [provider]  Ascorbic Acid (VITAMIN C PO) Take 1 tablet by mouth daily.    [provider]  aspirin 81 MG tablet Take 81 mg by mouth daily.    [provider]  B Complex-C (SUPER B COMPLEX PO) Take 1 tablet by mouth daily.    [provider]  CINNAMON PO Take 1,000 mg by mouth 2 (two) times daily.    [provider]  clopidogrel (PLAVIX) 75 MG tablet Take 1 tablet (75 mg total) by mouth daily. 01/17/22   End, Harrell Gave, MD  cyclobenzaprine (FLEXERIL) 10 MG tablet Take 10 mg by mouth at bedtime as needed for muscle spasms.    [provider]  cycloSPORINE (RESTASIS) 0.05 % ophthalmic emulsion Place 1 drop into both eyes 2 (two) times daily. Patient not taking: Reported on 01/02/2023    [provider]  diphenhydrAMINE (BENADRYL) 25 MG tablet Take 25 mg by mouth daily as needed for allergies.    [provider]  esomeprazole (NEXIUM) 40 MG capsule Take 40 mg by mouth at bedtime.     [provider]  furosemide (LASIX) 20 MG tablet Take 20 mg by mouth daily. 08/17/20   [provider]  glipiZIDE (GLUCOTROL) 10 MG tablet Take 20mg  (2 tabs) by mouth every morning & 10mg  (1 tab) at suppertime    [provider]  Melatonin 10 MG TABS Take 10 mg by mouth at bedtime.    [provider]  metFORMIN (GLUCOPHAGE-XR) 500 MG 24 hr tablet Take 2 tablets (1,000 mg total) by mouth daily with supper. 01/19/22   End, Harrell Gave, MD  metoprolol succinate (TOPROL-XL) 25 MG 24 hr tablet TAKE 1/2 TABLETS (12.5 MG TOTAL) BY  MOUTH DAILY. 02/20/21   Satira Sark, MD  Multiple Vitamins-Minerals (MULTIVITAMIN ADULTS 50+ PO) Take 1 tablet by mouth daily.    [provider]  nitroGLYCERIN (NITROSTAT) 0.4 MG SL tablet Place 1 tablet (0.4 mg total) under the tongue every 5 (five) minutes x 3 doses as needed for chest pain (if no relief after 2nd dose, proceed to the ED or call 911). 01/07/22   Satira Sark, MD  pregabalin (LYRICA) 75 MG capsule Take 75 mg by mouth 3 (three) times daily. 12/30/21   [provider]  Semaglutide, 1 MG/DOSE, (OZEMPIC, 1 MG/DOSE,) 2 MG/1.5ML SOPN Inject 1 mg into the skin every Tuesday.    [provider]  Tamsulosin HCl (FLOMAX) 0.4 MG CAPS Take 0.4 mg by mouth daily after supper.     [provider]    Physical Exam    Vital Signs:  RANELL SKIBINSKI does not have vital signs available for review today.  Given telephonic nature of communication, physical  exam is limited. AAOx3. NAD. Normal affect.  Speech and respirations are unlabored.  Accessory Clinical Findings    None  Assessment & Plan    1.  Preoperative Cardiovascular Risk Assessment: The patient affirms he has been doing well without any new cardiac symptoms. They are able to achieve 4 METS without cardiac limitations. Therefore, based on ACC/AHA guidelines, the patient would be at acceptable risk for the planned procedure without further cardiovascular testing. The patient was advised that if he develops new symptoms prior to surgery to contact our office to arrange for a follow-up visit, and he verbalized understanding.   Mr. Pavlov perioperative risk of a major cardiac event is 6.6% according to the Revised Cardiac Risk Index (RCRI).  Therefore, he is at High risk for perioperative complications.   His functional capacity is fair at 4.31 METs according to the Duke Activity Status Index (DASI). Recommendations: According to ACC/AHA guidelines, no further cardiovascular testing needed.   The patient may proceed to surgery at acceptable risk.   Antiplatelet and/or Anticoagulation Recommendations: Clopidogrel (Plavix) can be held for 5 days prior to his surgery and resumed as soon as possible post op.    The patient was advised that if he develops new symptoms prior to surgery to contact our office to arrange for a follow-up visit, and he verbalized understanding.   A copy of this note will be routed to requesting surgeon.  Time:   Today, I have spent 9 minutes with the patient with telehealth technology discussing medical history, symptoms, and management plan.     Napoleon Form, Leodis Rains, NP  01/07/2023, 7:55 AM

## 2023-02-11 ENCOUNTER — Other Ambulatory Visit: Payer: Self-pay | Admitting: Internal Medicine

## 2023-02-11 NOTE — Telephone Encounter (Signed)
Please assist patient with Plavix refill. Thank you

## 2023-04-02 ENCOUNTER — Telehealth: Payer: Self-pay

## 2023-04-02 ENCOUNTER — Ambulatory Visit: Payer: Medicare Other | Admitting: Cardiology

## 2023-04-02 NOTE — Telephone Encounter (Signed)
Prior Authorization for Repatha SureClick 140 mg/mL  Request Reference Number: [id number]. REPATHA SURE INJ 140MG /ML is approved through 12/22/2023.

## 2023-05-25 ENCOUNTER — Encounter: Payer: Self-pay | Admitting: *Deleted

## 2023-05-25 NOTE — Progress Notes (Unsigned)
    Cardiology Office Note  Date: 05/26/2023   ID: ASAH RAMSIER, DOB [birth year], MRN [id number]  History of Present Illness: Daniel Rhodes is a 72 y.o. male last seen in January by Mr. Louanne Skye NP, I reviewed the note.  He was seen for preoperative cardiac assessment prior to planned radical left nephrectomy which he underwent in February through the [facility] system, diagnosis of renal cell carcinoma with subsequent adjuvant chemotherapy.  He is here for a routine visit.  Reports no angina or nitroglycerin use in the interim.  We went over his medications and discussed continuation of aspirin 81 mg daily, but will stay off Plavix.  He was seen in the lipid clinic with recommendation to initiate Repatha, however decided not to pursue this medication.  We will try Zetia with prior history of statin intolerance and also abnormal LFTs on statin therapy.  His LDL was 129 in April.  Physical Exam: VS:  BP 118/70   Pulse 76   Ht 5\' 9"  (1.753 m)   Wt 178 lb 6.4 oz (80.9 kg)   SpO2 98%   BMI 26.35 kg/m , BMI Body mass index is 26.35 kg/m.  Wt Readings from Last 3 Encounters:  05/26/23 178 lb 6.4 oz (80.9 kg)  09/08/22 193 lb 12.8 oz (87.9 kg)  02/06/22 194 lb 6.4 oz (88.2 kg)    General: Patient appears comfortable at rest. HEENT: Conjunctiva and lids normal. Neck: Supple, no elevated JVP or carotid bruits. Lungs: Clear to auscultation, nonlabored breathing at rest. Cardiac: Regular rate and rhythm, no S3 or significant systolic murmur, no pericardial rub. Extremities: No pitting edema.  ECG:  An ECG dated 01/16/2022 was personally reviewed today and demonstrated:  Sinus rhythm with prolonged PR interval and nonspecific T wave changes.  Labwork:  April 2024: Cholesterol 199, triglycerides 187, HDL 36, LDL 129 May 2024: Hemoglobin 12.6, platelets 248, BUN 19, creatinine 1.4, potassium 3.9, TSH 1.2  Other Studies Reviewed Today:  No interval cardiac testing for review today.  Assessment  and Plan:  1.  CAD status post DES to the mid LAD and second diagonal bifurcation with associated angioplasty of the second diagonal in January 2023.  Residual disease includes mild to moderate circumflex/obtuse marginal stenosis managed medically.  LVEF normal at angiography in January 2023.  He reports no active angina or nitroglycerin use.  Plan to continue aspirin 81 mg daily, stay off Plavix.  Otherwise on Toprol-XL, also Ozempic with concurrent type 2 diabetes mellitus.  2.  Mixed hyperlipidemia with statin myalgias and prior history of elevated LFTs.  He was seen in the lipid clinic with recommendation to initiate Repatha, chart reviewed.  He has elected not to pursue therapy with PCSK9 inhibitors.  Plan to initiate Zetia 10 mg daily.  Follow-up FLP in 6 months.  3.  Essential hypertension.  Blood pressure is well-controlled today.  No changes to current regimen.  Disposition:  Follow up  6 months.  Signed, Jonelle Sidle, M.D., F.A.C.C. [facility]

## 2023-05-26 ENCOUNTER — Encounter: Payer: Self-pay | Admitting: Cardiology

## 2023-05-26 ENCOUNTER — Ambulatory Visit: Payer: Medicare Other | Attending: Cardiology | Admitting: Cardiology

## 2023-05-26 VITALS — BP 118/70 | HR 76 | Ht 69.0 in | Wt 178.4 lb

## 2023-05-26 DIAGNOSIS — Z789 Other specified health status: Secondary | ICD-10-CM | POA: Diagnosis not present

## 2023-05-26 DIAGNOSIS — E782 Mixed hyperlipidemia: Secondary | ICD-10-CM

## 2023-05-26 DIAGNOSIS — I25119 Atherosclerotic heart disease of native coronary artery with unspecified angina pectoris: Secondary | ICD-10-CM

## 2023-05-26 DIAGNOSIS — I10 Essential (primary) hypertension: Secondary | ICD-10-CM | POA: Diagnosis not present

## 2023-05-26 MED ORDER — NITROGLYCERIN 0.4 MG SL SUBL: 0.4000 mg | SUBLINGUAL_TABLET | SUBLINGUAL | 3 refills | Status: DC | PRN

## 2023-05-26 MED ORDER — EZETIMIBE 10 MG PO TABS: 10.0000 mg | ORAL_TABLET | Freq: Every day | ORAL | 3 refills | Status: DC

## 2023-05-26 NOTE — Patient Instructions (Signed)
Medication Instructions:   Stop taking Plavix  Start Taking Aspirin 81 mg  Daily  Start Zetia 10 mg Daily   *If you need a refill on your cardiac medications before your next appointment, please call your pharmacy*   Lab Work: Your physician recommends that you return for lab work in: 6 months just prior to your next visit ( Lipid)   If you have labs (blood work) drawn today and your tests are completely normal, you will receive your results only by: MyChart Message (if you have MyChart) OR A paper copy in the mail If you have any lab test that is abnormal or we need to change your treatment, we will call you to review the results.   Testing/Procedures: NONE     Follow-Up: At [facility], you and your health needs are our priority.  As part of our continuing mission to provide you with exceptional heart care, we have created designated Provider Care Teams.  These Care Teams include your primary Cardiologist (physician) and Advanced Practice Providers (APPs -  Physician Assistants and Nurse Practitioners) who all work together to provide you with the care you need, when you need it.  We recommend signing up for the patient portal called "MyChart".  Sign up information is provided on this After Visit Summary.  MyChart is used to connect with patients for Virtual Visits (Telemedicine).  Patients are able to view lab/test results, encounter notes, upcoming appointments, etc.  Non-urgent messages can be sent to your provider as well.   To learn more about what you can do with MyChart, go to [email or web address].    Your next appointment:   6 month(s)  Provider:   You may see Nona Dell, MD or the following Advanced Practice Provider on your designated Care Team:   Sharlene Dory, NP    Other Instructions Thank you for choosing [facility]!

## 2023-12-09 ENCOUNTER — Ambulatory Visit: Payer: Medicare Other | Attending: Cardiology | Admitting: Cardiology

## 2023-12-09 ENCOUNTER — Encounter: Payer: Self-pay | Admitting: Cardiology

## 2023-12-09 VITALS — BP 128/70 | HR 67 | Ht 69.0 in | Wt 195.0 lb

## 2023-12-09 DIAGNOSIS — E782 Mixed hyperlipidemia: Secondary | ICD-10-CM | POA: Diagnosis not present

## 2023-12-09 DIAGNOSIS — I10 Essential (primary) hypertension: Secondary | ICD-10-CM

## 2023-12-09 DIAGNOSIS — Z789 Other specified health status: Secondary | ICD-10-CM

## 2023-12-09 DIAGNOSIS — I25119 Atherosclerotic heart disease of native coronary artery with unspecified angina pectoris: Secondary | ICD-10-CM

## 2023-12-09 MED ORDER — METOPROLOL SUCCINATE ER 25 MG PO TB24: ORAL_TABLET | ORAL | 3 refills | Status: AC

## 2023-12-09 MED ORDER — EZETIMIBE 10 MG PO TABS: 10.0000 mg | ORAL_TABLET | Freq: Every day | ORAL | 3 refills | Status: DC

## 2023-12-09 NOTE — Progress Notes (Signed)
    Cardiology Office Note  Date: 12/09/2023   ID: Daniel Rhodes, DOB July 30, 1951, MRN [id number]  History of Present Illness: Daniel Rhodes is a 72 y.o. male last seen in June.  He is here for a routine visit.  States that he is doing well, no angina or nitroglycerin use, stable NYHA class II dyspnea.  He indicates that he just recently stopped immunotherapy and that he is renal cell carcinoma is in remission.  He continues to follow with oncology.  I reviewed his medications.  Current cardiac regimen includes aspirin, Zetia, Toprol-XL, Lasix and as needed nitroglycerin.  He is also on Ozempic for concurrent treatment of type 2 diabetes mellitus.  Recent lab work as noted below.  Cholesterol has improved overall.  Physical Exam: VS:  BP 128/70   Pulse 67   Ht 5\' 9"  (1.753 m)   Wt 195 lb (88.5 kg)   SpO2 99%   BMI 28.80 kg/m , BMI Body mass index is 28.8 kg/m.  Wt Readings from Last 3 Encounters:  12/09/23 195 lb (88.5 kg)  05/26/23 178 lb 6.4 oz (80.9 kg)  09/08/22 193 lb 12.8 oz (87.9 kg)    General: Patient appears comfortable at rest. HEENT: Conjunctiva and lids normal. Neck: Supple, no elevated JVP or carotid bruits. Lungs: Clear to auscultation, nonlabored breathing at rest. Cardiac: Regular rate and rhythm, no S3 or significant systolic murmur, no pericardial rub.  ECG:  An ECG dated 01/16/2022 was personally reviewed today and demonstrated:  Sinus rhythm with prolonged PR interval and nonspecific T wave changes.  Labwork:  October 2024: Hemoglobin A1c 7.4%, cholesterol 192, triglycerides 190, HDL 42, LDL 117 December 2024: Hemoglobin 13.9, platelets 219, BUN 21, creatinine 1.41, potassium 4.1, AST 14, ALT 12, hemoglobin A1c 7.7%, cholesterol 160, triglycerides 159, HDL 41, LDL 91  Other Studies Reviewed Today:  No interval cardiac testing for review today.  Assessment and Plan:  1.  CAD status post DES to the mid LAD and second diagonal bifurcation with  associated angioplasty of the second diagonal in January 2023.  Residual disease includes mild to moderate circumflex/obtuse marginal stenosis managed medically.  LVEF normal at angiography in January 2023.  He is doing well without angina or nitroglycerin use.  Continue aspirin, Toprol-XL, and Zetia.   2.  Mixed hyperlipidemia with statin myalgias and prior history of elevated LFTs.  He was seen in the lipid clinic with recommendation to initiate Repatha, however he elected not to pursue therapy with PCSK9 inhibitors.  Currently tolerating Zetia with improvement in lipids, most recent LDL down to 91.   3.  Primary hypertension.  Blood pressure is adequately controlled today on current regimen.  No changes were made.  Disposition:  Follow up  6 months.  Signed, Jonelle Sidle, M.D., F.A.C.C. [facility] at [facility]

## 2023-12-09 NOTE — Patient Instructions (Signed)

## 2023-12-17 ENCOUNTER — Encounter: Payer: Self-pay | Admitting: Cardiology

## 2024-07-08 ENCOUNTER — Ambulatory Visit: Attending: Cardiology | Admitting: Cardiology

## 2024-07-08 ENCOUNTER — Encounter: Payer: Self-pay | Admitting: Cardiology

## 2024-07-08 VITALS — BP 136/64 | HR 64 | Ht 69.0 in | Wt 206.0 lb

## 2024-07-08 DIAGNOSIS — Z789 Other specified health status: Secondary | ICD-10-CM

## 2024-07-08 DIAGNOSIS — E782 Mixed hyperlipidemia: Secondary | ICD-10-CM

## 2024-07-08 DIAGNOSIS — I10 Essential (primary) hypertension: Secondary | ICD-10-CM | POA: Diagnosis not present

## 2024-07-08 DIAGNOSIS — I25119 Atherosclerotic heart disease of native coronary artery with unspecified angina pectoris: Secondary | ICD-10-CM | POA: Diagnosis not present

## 2024-07-08 MED ORDER — NEXLETOL 180 MG PO TABS: 1.0000 | ORAL_TABLET | Freq: Every day | ORAL | Status: AC

## 2024-07-08 MED ORDER — NEXLETOL 180 MG PO TABS: 1.0000 | ORAL_TABLET | Freq: Every day | ORAL | 6 refills | Status: DC

## 2024-07-08 NOTE — Patient Instructions (Addendum)
 Medication Instructions:  Your physician has recommended you make the following change in your medication: Start nexletol 180 mg daily Continue all other medications as prescribed  Labwork: none  Testing/Procedures: none  Follow-Up: Your physician recommends that you schedule a follow-up appointment in: 6 months  Any Other Special Instructions Will Be Listed Below (If Applicable).  If you need a refill on your cardiac medications before your next appointment, please call your pharmacy.

## 2024-07-08 NOTE — Progress Notes (Signed)
    Cardiology Office Note  Date: 07/08/2024   ID: Daniel Rhodes, DOB July 03, 1951, MRN [id number]  History of Present Illness: Daniel Rhodes is a 73 y.o. male last seen in December 2024.  He is here for a routine visit.  He does not report any angina or interval nitroglycerin  use.  No specific change in stamina.  He has gained about 20 pounds since coming off Ozempic and switch to insulin for treatment of type 2 diabetes mellitus per PCP.  We went over his medications.  No other specific changes to cardiac regimen other than the discontinuation of Ozempic.  I did review his recent lab work, LDL up to 126.  I reviewed his ECG today which shows sinus rhythm with prolonged PR interval.  Physical Exam: VS:  BP 136/64 (BP Location: Right Arm)   Pulse 64   Ht 5' 9 (1.753 m)   Wt 206 lb (93.4 kg)   SpO2 96%   BMI 30.42 kg/m , BMI Body mass index is 30.42 kg/m.  Wt Readings from Last 3 Encounters:  07/08/24 206 lb (93.4 kg)  12/09/23 195 lb (88.5 kg)  05/26/23 178 lb 6.4 oz (80.9 kg)    General: Patient appears comfortable at rest. HEENT: Conjunctiva and lids normal. Neck: Supple, no elevated JVP or carotid bruits. Lungs: Clear to auscultation, nonlabored breathing at rest. Cardiac: Regular rate and rhythm, no S3 or significant systolic murmur.  ECG:  An ECG dated 01/16/2022 was personally reviewed today and demonstrated:  Sinus rhythm with prolonged PR interval and nonspecific T wave changes.  Labwork:  July 2025: BUN 26, creatinine 1.71, GFR 41, potassium 4.5, AST 16, ALT 14, hemoglobin 13.9, platelets 215, cholesterol 192, triglycerides 126, HDL 43, LDL 126, hemoglobin A1c 7.6%  Other Studies Reviewed Today:  No interval cardiac testing for review today.  Assessment and Plan:  1.  CAD status post DES to the mid LAD and second diagonal bifurcation with associated angioplasty of the second diagonal in January 2023.  Residual disease includes mild to moderate  circumflex/obtuse marginal stenosis managed medically.  LVEF normal at angiography in January 2023.  He does not report any angina or interval nitroglycerin  use.  Would continue aspirin  81 mg daily, Zetia  10 mg daily, Toprol -XL 12.5 mg daily, and as needed nitroglycerin .   2.  Mixed hyperlipidemia with statin myalgias and prior history of elevated LFTs.  He was seen in the lipid clinic with recommendation to initiate Repatha, however he elected not to pursue therapy with PCSK9 inhibitors.  Recent LDL 126 and HDL 43 on Zetia  10 mg daily.  He has also gained about 20 pounds since coming off Ozempic and starting insulin.  Discussed addition of bempedoic acid 180 mg daily.  If tolerated can recheck lipids later in the year.   3.  Primary hypertension.  No changes made to current regimen.  Disposition:  Follow up 6 months.  Signed, Jayson JUDITHANN Sierras, M.D., F.A.C.C. [facility] at [facility]

## 2024-07-08 NOTE — Addendum Note (Signed)
 Addended by: Mildreth Reek M on: 07/08/2024 09:57 AM   Modules accepted: Orders

## 2024-07-11 ENCOUNTER — Telehealth: Payer: Self-pay | Admitting: Pharmacy Technician

## 2024-07-11 ENCOUNTER — Other Ambulatory Visit (HOSPITAL_COMMUNITY): Payer: Self-pay

## 2024-07-11 NOTE — Telephone Encounter (Signed)
 Pharmacy Patient Advocate Encounter   Received notification from CoverMyMeds that prior authorization for  Nexletol  180MG  tablets is required/requested.   Insurance verification completed.   The patient is insured through [place] .   Per test claim: PA required; PA submitted to above mentioned insurance via LATENT Key/confirmation #/[id number] Status is pending

## 2024-07-11 NOTE — Telephone Encounter (Signed)
 Pharmacy Patient Advocate Encounter  Received notification from [facility] that Prior Authorization for nexletol  180mg  has been APPROVED from 07/11/24 to 01/11/25   PA #/Case ID/Reference #: [id number]

## 2024-07-17 ENCOUNTER — Encounter: Payer: Self-pay | Admitting: Cardiology

## 2024-07-28 ENCOUNTER — Other Ambulatory Visit (HOSPITAL_COMMUNITY): Payer: Self-pay

## 2024-07-28 ENCOUNTER — Encounter: Payer: Self-pay | Admitting: Cardiology

## 2024-07-28 ENCOUNTER — Telehealth: Payer: Self-pay

## 2024-07-28 NOTE — Telephone Encounter (Signed)
 Patient Advocate Encounter   The patient was approved for a [employer] grant that will help cover the cost of NEXLETOL  Total amount awarded, $2,500.  Effective: 06/28/24 - 06/27/25   APW:[id number] ERW:[id number] Hmnle:[id number] PI:[id number]   Pharmacy provided with approval and processing information. Patient informed via rhona Ileana Lehmann, CPhT  Pharmacy Patient Advocate Specialist  Direct Number: [phone] Fax: [phone]

## 2024-07-28 NOTE — Telephone Encounter (Signed)
 Enrolled in grant. Information has been faxed to pharmacy and pt informed via mychart.

## 2024-09-11 ENCOUNTER — Encounter: Payer: Self-pay | Admitting: Cardiology

## 2024-09-20 ENCOUNTER — Encounter: Payer: Self-pay | Admitting: Cardiology

## 2024-12-21 ENCOUNTER — Other Ambulatory Visit: Payer: Self-pay | Admitting: Cardiology

## 2024-12-23 ENCOUNTER — Other Ambulatory Visit: Payer: Self-pay | Admitting: Cardiology

## 2025-01-02 ENCOUNTER — Other Ambulatory Visit (HOSPITAL_COMMUNITY): Payer: Self-pay

## 2025-01-02 ENCOUNTER — Telehealth: Payer: Self-pay | Admitting: Pharmacy Technician

## 2025-01-02 NOTE — Telephone Encounter (Signed)
" ° °  Pharmacy Patient Advocate Encounter   Received notification from [facility] that prior authorization for nexeltol is required/requested.   Insurance verification completed.   The patient is insured through [facility].   Per test claim: PA required; PA submitted to above mentioned insurance via Latent Key/confirmation #/EOC [facility] Status is pending  "

## 2025-01-02 NOTE — Telephone Encounter (Signed)
 Pharmacy Patient Advocate Encounter  Received notification from [employer] that Prior Authorization for nexletol  has been APPROVED from 01/02/25 to 12/21/25   PA #/Case ID/Reference #: [id number]

## 2025-01-30 ENCOUNTER — Ambulatory Visit: Admitting: Cardiology
